# Patient Record
Sex: Male | Born: 1955 | Race: Black or African American | Hispanic: No | Marital: Single | Smoking: Former smoker
Health system: Southern US, Community
[De-identification: ages and names within clinical notes are randomized; demographics above are authoritative.]

## PROBLEM LIST (undated history)

## (undated) DIAGNOSIS — E785 Hyperlipidemia, unspecified: Secondary | ICD-10-CM

## (undated) DIAGNOSIS — I10 Essential (primary) hypertension: Secondary | ICD-10-CM

## (undated) DIAGNOSIS — S069XAA Unspecified intracranial injury with loss of consciousness status unknown, initial encounter: Secondary | ICD-10-CM

## (undated) DIAGNOSIS — I2510 Atherosclerotic heart disease of native coronary artery without angina pectoris: Secondary | ICD-10-CM

## (undated) HISTORY — DX: Unspecified intracranial injury with loss of consciousness status unknown, initial encounter: S06.9XAA

## (undated) HISTORY — PX: VENTRICULOPERITONEAL SHUNT: SHX204

---

## 2010-09-08 ENCOUNTER — Ambulatory Visit (HOSPITAL_BASED_OUTPATIENT_CLINIC_OR_DEPARTMENT_OTHER)
Admission: RE | Admit: 2010-09-08 | Discharge: 2010-09-08 | Disposition: A | Discharge status: Home / Self Care | Payer: Medicaid Other | Source: Ambulatory Visit | Attending: Internal Medicine | Admitting: Internal Medicine

## 2010-09-08 ENCOUNTER — Other Ambulatory Visit (HOSPITAL_BASED_OUTPATIENT_CLINIC_OR_DEPARTMENT_OTHER): Payer: Self-pay | Admitting: Internal Medicine

## 2010-09-08 DIAGNOSIS — R609 Edema, unspecified: Secondary | ICD-10-CM

## 2010-09-08 DIAGNOSIS — M7989 Other specified soft tissue disorders: Secondary | ICD-10-CM | POA: Insufficient documentation

## 2010-09-08 DIAGNOSIS — M79609 Pain in unspecified limb: Secondary | ICD-10-CM

## 2010-09-10 ENCOUNTER — Emergency Department (INDEPENDENT_AMBULATORY_CARE_PROVIDER_SITE_OTHER): Payer: Medicaid Other

## 2010-09-10 ENCOUNTER — Other Ambulatory Visit: Payer: Self-pay

## 2010-09-10 ENCOUNTER — Emergency Department (HOSPITAL_BASED_OUTPATIENT_CLINIC_OR_DEPARTMENT_OTHER)
Admission: EM | Admit: 2010-09-10 | Discharge: 2010-09-11 | Disposition: A | Discharge status: Other Acute Inpatient Hospital | Payer: Medicaid Other | Source: Home / Self Care | Attending: Emergency Medicine | Admitting: Emergency Medicine

## 2010-09-10 ENCOUNTER — Encounter: Payer: Self-pay | Admitting: *Deleted

## 2010-09-10 DIAGNOSIS — R748 Abnormal levels of other serum enzymes: Secondary | ICD-10-CM

## 2010-09-10 DIAGNOSIS — I2510 Atherosclerotic heart disease of native coronary artery without angina pectoris: Secondary | ICD-10-CM | POA: Insufficient documentation

## 2010-09-10 DIAGNOSIS — R197 Diarrhea, unspecified: Secondary | ICD-10-CM | POA: Insufficient documentation

## 2010-09-10 DIAGNOSIS — R112 Nausea with vomiting, unspecified: Secondary | ICD-10-CM | POA: Insufficient documentation

## 2010-09-10 DIAGNOSIS — R0602 Shortness of breath: Secondary | ICD-10-CM

## 2010-09-10 DIAGNOSIS — L03115 Cellulitis of right lower limb: Secondary | ICD-10-CM

## 2010-09-10 DIAGNOSIS — L03119 Cellulitis of unspecified part of limb: Secondary | ICD-10-CM | POA: Insufficient documentation

## 2010-09-10 DIAGNOSIS — R079 Chest pain, unspecified: Secondary | ICD-10-CM

## 2010-09-10 DIAGNOSIS — L02419 Cutaneous abscess of limb, unspecified: Secondary | ICD-10-CM | POA: Insufficient documentation

## 2010-09-10 DIAGNOSIS — R109 Unspecified abdominal pain: Secondary | ICD-10-CM | POA: Insufficient documentation

## 2010-09-10 DIAGNOSIS — I10 Essential (primary) hypertension: Secondary | ICD-10-CM | POA: Insufficient documentation

## 2010-09-10 HISTORY — DX: Essential (primary) hypertension: I10

## 2010-09-10 HISTORY — DX: Atherosclerotic heart disease of native coronary artery without angina pectoris: I25.10

## 2010-09-10 LAB — URINALYSIS, ROUTINE W REFLEX MICROSCOPIC
Bilirubin Urine: NEGATIVE
Glucose, UA: NEGATIVE mg/dL
Hgb urine dipstick: NEGATIVE
Ketones, ur: NEGATIVE mg/dL
Leukocytes, UA: NEGATIVE
Nitrite: NEGATIVE
Protein, ur: NEGATIVE mg/dL
Specific Gravity, Urine: 1.011 (ref 1.005–1.030)
Urobilinogen, UA: 0.2 mg/dL (ref 0.0–1.0)
pH: 6 (ref 5.0–8.0)

## 2010-09-10 LAB — DIFFERENTIAL
Basophils Absolute: 0 K/uL (ref 0.0–0.1)
Basophils Relative: 0 % (ref 0–1)
Eosinophils Absolute: 0.2 10*3/uL (ref 0.0–0.7)
Eosinophils Relative: 3 % (ref 0–5)
Lymphocytes Relative: 25 % (ref 12–46)
Lymphs Abs: 1.7 10*3/uL (ref 0.7–4.0)
Monocytes Absolute: 0.6 K/uL (ref 0.1–1.0)
Monocytes Relative: 9 % (ref 3–12)
Neutro Abs: 4.3 10*3/uL (ref 1.7–7.7)
Neutrophils Relative %: 64 % (ref 43–77)

## 2010-09-10 LAB — COMPREHENSIVE METABOLIC PANEL
ALT: 69 U/L — ABNORMAL HIGH (ref 0–53)
Alkaline Phosphatase: 88 U/L (ref 39–117)
CO2: 24 mEq/L (ref 19–32)
GFR calc Af Amer: >60 mL/min (ref >60)
Glucose, Bld: 90 mg/dL (ref 70–99)
Potassium: 4.2 mEq/L (ref 3.5–5.1)
Sodium: 138 mEq/L (ref 135–145)
Total Protein: 7.7 g/dL (ref 6.0–8.3)

## 2010-09-10 LAB — COMPREHENSIVE METABOLIC PANEL WITH GFR
AST: 46 U/L — ABNORMAL HIGH (ref 0–37)
Albumin: 4 g/dL (ref 3.5–5.2)
BUN: 11 mg/dL (ref 6–23)
Calcium: 9.6 mg/dL (ref 8.4–10.5)
Chloride: 104 meq/L (ref 96–112)
Creatinine, Ser: 1.3 mg/dL (ref 0.50–1.35)
GFR calc non Af Amer: 57 mL/min — ABNORMAL LOW (ref >60)
Total Bilirubin: 0.3 mg/dL (ref 0.3–1.2)

## 2010-09-10 LAB — CBC
HCT: 39.9 % (ref 39.0–52.0)
Hemoglobin: 14.2 g/dL (ref 13.0–17.0)
MCH: 31.3 pg (ref 26.0–34.0)
MCHC: 35.6 g/dL (ref 30.0–36.0)
MCV: 87.9 fL (ref 78.0–100.0)
Platelets: 255 10*3/uL (ref 150–400)
RBC: 4.54 MIL/uL (ref 4.22–5.81)
RDW: 13.3 % (ref 11.5–15.5)
WBC: 6.7 10*3/uL (ref 4.0–10.5)

## 2010-09-10 LAB — MAGNESIUM: Magnesium: 2.4 mg/dL (ref 1.5–2.5)

## 2010-09-10 LAB — D-DIMER, QUANTITATIVE: D-Dimer, Quant: 1.35 ug{FEU}/mL — ABNORMAL HIGH (ref 0.00–0.48)

## 2010-09-10 MED ORDER — VANCOMYCIN HCL 500 MG IV SOLR
INTRAVENOUS | Status: AC
  Filled 2010-09-10: qty 500

## 2010-09-10 MED ORDER — IOHEXOL 350 MG/ML SOLN
80.0000 mL | Freq: Once | INTRAVENOUS | Status: AC | PRN
  Administered 2010-09-10: 80 mL via INTRAVENOUS

## 2010-09-10 MED ORDER — MORPHINE SULFATE 4 MG/ML IJ SOLN
4.0000 mg | INTRAMUSCULAR | Status: DC | PRN
  Administered 2010-09-11: 4 mg via INTRAVENOUS
  Filled 2010-09-10: qty 1

## 2010-09-10 MED ORDER — VANCOMYCIN HCL 10 G IV SOLR
1.0000 g | Freq: Once | INTRAVENOUS | Status: AC
  Administered 2010-09-10: 1 g via INTRAVENOUS
  Filled 2010-09-10: qty 1000

## 2010-09-10 MED ORDER — FENTANYL CITRATE 0.05 MG/ML IJ SOLN
100.0000 ug | Freq: Once | INTRAMUSCULAR | Status: AC
  Administered 2010-09-10: 100 ug via INTRAVENOUS
  Filled 2010-09-10: qty 2

## 2010-09-10 NOTE — ED Notes (Signed)
Lower abdominal pain onset Saturday when started he was in [facility] they did stool sample said "infection in stool" was at hospital for infection in leg. Still having cramping and pain ...states "c diff" . Started being short of breath last night.

## 2010-09-10 NOTE — ED Notes (Signed)
Pt signed consent to obtain medical records of recent admission to [facility]

## 2010-09-10 NOTE — ED Notes (Signed)
Pt was admitted to [facility] for antibiotic treatment of cellulitis . He was released on Sunday states they gave him  " a bag of magnesium sulfate" before he left and the leg "swelled up and got worse" states he was sent home with doxycycline but quit taking it because it wasn't working also diagnosed by stool culture with c diff per patient and given metrodiazonole to take  500 mg every 8 hours pt took it for a day and "wasnt working" so quit taking it he had some bactrim 800 mg he was taking prior to going into the hospital and some amoxicillin clavunate  He was to take them together to treat cellutlits prior to being admitted had some left took it yesterday and today because he thinks that is what works but is concerned that they are too strong together so would rather just take one.

## 2010-09-10 NOTE — ED Notes (Signed)
Pt transported to xray 

## 2010-09-10 NOTE — ED Provider Notes (Addendum)
History     Chief Complaint  Patient presents with  . Abdominal Pain  . Shortness of Breath   HPI Comments: Pt developed pain and swelling to right lower leg from thigh to ankle and seen in the ED on 7/16.  He has h/o prior spider bite to right calf 1-2 years ago.  He had had chills, no CP or SOB, feeling weak.  He was seen at [facility] ED and was put on augmentin and bactrim antibiotics, went back for a ultrasound of leg that was negative by his report.  However 2 days later not getting better went back to the ED and was admitted.  He was put on an IV abx, but not helping, so switched to telfro which did help pain and swelling in right lower leg.  He did have complication of abdominal cramping, N/V/D and reportedly was then put on oral flagyl.  His vomiting and cramping improved, but is still having loose stools 2-3 times per day.  At home, he was released after receiving magnesium injection and given prescriptions for doxycycline and flagyl, but he only took 3 days of it due to persistent low appetite, abd bloating, upper epigastric pains and mild nausea.  He resumed taking his prior abx, the augmentin and bactrim and his abd cramping, and SOB has improved, although still present.  Also his right leg pain and swelling has again gotten worse again.  He is still takign his aspirn and lisinopril, but not the flagyl.    Patient is a 55 y.o. male presenting with abdominal pain and shortness of breath. The history is provided by the patient and a relative.  Abdominal Pain The primary symptoms of the illness include abdominal pain, shortness of breath, nausea, vomiting and diarrhea.  Additional symptoms associated with the illness include chills. Symptoms associated with the illness do not include back pain.  Shortness of Breath  Associated symptoms include chest pain and shortness of breath.    Past Medical History  Diagnosis Date  . Hypertension   . Coronary artery disease     History reviewed. No  pertinent past surgical history.  No family history on file.  History  Substance Use Topics  . Smoking status: Not on file  . Smokeless tobacco: Not on file  . Alcohol Use:       Review of Systems  Constitutional: Positive for chills and appetite change.  HENT: Negative.   Eyes: Negative.   Respiratory: Positive for shortness of breath.   Cardiovascular: Positive for chest pain.  Gastrointestinal: Positive for nausea, vomiting, abdominal pain, diarrhea and abdominal distention. Negative for blood in stool.  Genitourinary: Negative for flank pain and difficulty urinating.  Musculoskeletal: Positive for arthralgias. Negative for back pain and joint swelling.  Skin: Positive for color change. Negative for wound.    Physical Exam  BP 169/91  Pulse 64  Resp 20  Wt 177 lb 2 oz (80.343 kg)  SpO2 100%  Physical Exam  Constitutional: He is oriented to person, place, and time. He appears well-developed and well-nourished.  HENT:  Head: Normocephalic and atraumatic.  Eyes: Pupils are equal, round, and reactive to light.  Cardiovascular: Bradycardia present.   No murmur heard. Pulmonary/Chest: Effort normal and breath sounds normal. No respiratory distress.  Abdominal: Soft. Bowel sounds are normal. He exhibits no distension. There is no tenderness. There is no rebound.  Musculoskeletal:       Right upper leg: He exhibits swelling and edema. He exhibits no tenderness and  no deformity.       Right lower leg: He exhibits tenderness, swelling and edema. He exhibits no deformity.  Neurological: He is alert and oriented to person, place, and time. He has normal strength. No sensory deficit.  Skin: Skin is warm and intact. No rash noted.    ED Course  Procedures  MDM Pt's right leg is sig swollen, I think oral abx will not improve this, also with some new symptoms of epigastric pain, SOB and some pleurisy, although sats are normal, will need CT I think to r/o PE.  Will star ton IV  vanc which should cover cellulitis and also help with treatment of what I suspect to be C diff based on what meds he was put on upon discharge at [facility].  He does not wish to go back to [facility].     11:23 PM No PE on CT of chest.  sats ok, BP is only mildly elevated.    ECG at time 2009 shows NSR at rate 52, poor r wave progression, normal ST segments, minimal LAD, borderline PR length   Spoke to Dr. Toniann Fail who accepts in transfer to [facility] on medical floor.    Gavin Pound. Nicolle Heward, MD 09/10/10 2324   EKG shows rate of 53, normal interval, normal axis, poor r wave progression with Q wave Lead V1-V2. No prior EKG's available.  Gavin Pound. Oletta Lamas, MD 11/02/10 1945

## 2010-09-11 ENCOUNTER — Inpatient Hospital Stay (HOSPITAL_COMMUNITY): Payer: Medicaid Other

## 2010-09-11 ENCOUNTER — Inpatient Hospital Stay (HOSPITAL_COMMUNITY)
Admission: AD | Admit: 2010-09-11 | Discharge: 2010-09-13 | DRG: 372 | Disposition: A | Discharge status: Home / Self Care | Payer: Medicaid Other | Source: Other Acute Inpatient Hospital | Attending: Internal Medicine | Admitting: Internal Medicine

## 2010-09-11 DIAGNOSIS — F22 Delusional disorders: Secondary | ICD-10-CM | POA: Diagnosis present

## 2010-09-11 DIAGNOSIS — A0472 Enterocolitis due to Clostridium difficile, not specified as recurrent: Principal | ICD-10-CM | POA: Diagnosis present

## 2010-09-11 DIAGNOSIS — E785 Hyperlipidemia, unspecified: Secondary | ICD-10-CM | POA: Diagnosis present

## 2010-09-11 DIAGNOSIS — R079 Chest pain, unspecified: Secondary | ICD-10-CM | POA: Diagnosis present

## 2010-09-11 DIAGNOSIS — L02419 Cutaneous abscess of limb, unspecified: Secondary | ICD-10-CM | POA: Diagnosis present

## 2010-09-11 DIAGNOSIS — F411 Generalized anxiety disorder: Secondary | ICD-10-CM | POA: Diagnosis present

## 2010-09-11 DIAGNOSIS — I517 Cardiomegaly: Secondary | ICD-10-CM

## 2010-09-11 DIAGNOSIS — M79609 Pain in unspecified limb: Secondary | ICD-10-CM

## 2010-09-11 DIAGNOSIS — I10 Essential (primary) hypertension: Secondary | ICD-10-CM | POA: Diagnosis present

## 2010-09-11 LAB — COMPREHENSIVE METABOLIC PANEL
CO2: 26 mEq/L (ref 19–32)
Calcium: 9.9 mg/dL (ref 8.4–10.5)
Creatinine, Ser: 1.13 mg/dL (ref 0.50–1.35)
GFR calc Af Amer: >60 mL/min (ref >60)
GFR calc non Af Amer: >60 mL/min (ref >60)
Glucose, Bld: 100 mg/dL — ABNORMAL HIGH (ref 70–99)
Total Bilirubin: 0.4 mg/dL (ref 0.3–1.2)

## 2010-09-11 LAB — CBC
MCH: 31.7 pg (ref 26.0–34.0)
MCHC: 35.9 g/dL (ref 30.0–36.0)
Platelets: 264 10*3/uL (ref 150–400)
RBC: 5.04 MIL/uL (ref 4.22–5.81)
RDW: 13.8 % (ref 11.5–15.5)

## 2010-09-11 LAB — RAPID URINE DRUG SCREEN, HOSP PERFORMED
Amphetamines: NOT DETECTED
Benzodiazepines: NOT DETECTED
Cocaine: NOT DETECTED
Opiates: NOT DETECTED
Tetrahydrocannabinol: NOT DETECTED

## 2010-09-11 LAB — CARDIAC PANEL(CRET KIN+CKTOT+MB+TROPI)
CK, MB: 2.5 ng/mL (ref 0.3–4.0)
Total CK: 318 U/L — ABNORMAL HIGH (ref 7–232)
Total CK: 304 U/L — ABNORMAL HIGH (ref 7–232)
Total CK: 295 U/L — ABNORMAL HIGH (ref 7–232)
Troponin I: <0.3 ng/mL (ref <0.30)

## 2010-09-11 LAB — TSH: TSH: 2.12 u[IU]/mL (ref 0.350–4.500)

## 2010-09-11 LAB — MAGNESIUM: Magnesium: 2.5 mg/dL (ref 1.5–2.5)

## 2010-09-12 LAB — BASIC METABOLIC PANEL
Chloride: 102 mEq/L (ref 96–112)
GFR calc Af Amer: >60 mL/min (ref >60)
Potassium: 3.9 mEq/L (ref 3.5–5.1)
Sodium: 136 mEq/L (ref 135–145)

## 2010-09-13 LAB — BASIC METABOLIC PANEL
BUN: 13 mg/dL (ref 6–23)
GFR calc Af Amer: >60 mL/min (ref >60)
GFR calc non Af Amer: >60 mL/min (ref >60)
Potassium: 3.8 mEq/L (ref 3.5–5.1)
Sodium: 138 mEq/L (ref 135–145)

## 2010-09-13 LAB — CBC
HCT: 41.8 % (ref 39.0–52.0)
MCHC: 36.1 g/dL — ABNORMAL HIGH (ref 30.0–36.0)
Platelets: 276 10*3/uL (ref 150–400)
RDW: 13.4 % (ref 11.5–15.5)

## 2010-09-13 LAB — HEPATITIS PANEL, ACUTE
HCV Ab: NEGATIVE
Hep A IgM: NEGATIVE
Hep B C IgM: NEGATIVE
Hepatitis B Surface Ag: NEGATIVE

## 2010-09-14 LAB — GIARDIA/CRYPTOSPORIDIUM SCREEN(EIA)

## 2010-09-15 LAB — STOOL CULTURE

## 2010-10-12 NOTE — Discharge Summary (Signed)
NAMEFERGUS, THRONE NO.:  [id number]  MEDICAL RECORD NO.:  [id number]  LOCATION:  5006                         FACILITY:  [facility]  PHYSICIAN:  Clydia Llano, MD       DATE OF BIRTH:  February 04, 1956  DATE OF ADMISSION:  09/11/2010 DATE OF DISCHARGE:  09/13/2010                              DISCHARGE SUMMARY   PRIMARY CARE PHYSICIAN:  [name], MD  REASON FOR ADMISSION:  Persistent swelling of the left lower extremity with diarrhea.  DISCHARGE DIAGNOSES: 1. Clostridium difficile colitis. 2. Right lower extremity cellulitis. 3. Chest pain, resolved. 4. Hypertension. 5. Dyslipidemia. 6. Transaminitis, resolved.  DISCHARGE MEDICATIONS: 1. Bactrim DS 1 tablet p.o. b.i.d. take for 7 days. 2. Aspirin 325 mg p.o. daily. 3. Lisinopril 10 mg p.o. daily. 4. Flagyl 500 mg every 8 hours for 10 more days. 5. Potassium chloride 20 mEq p.o. every 12 hours.  RADIOLOGY: 1. Right ankle x-ray showed no osseous abnormality. 2. Abdominal ultrasound on September 11, 2010, showed no acute findings. 3. CT angio negative for PE or thoracic aortic dissection. 4. Chest x-ray showed no acute disease. 5. Right lower extremity Doppler showed no evidence of DVT or     superficial thrombophlebitis in the right lower extremity or left     lower extremity. 6. Echocardiogram showed ejection fraction of about 63%.  Has Doppler     parameters consistent with grade 1 diastolic dysfunction.  There is     no significant aortic and mitral valve lesion, dysfunction.  There     are no wall motion abnormalities.  BRIEF HISTORY AND EXAMINATION:  Mr. Parson is a 55 year old male with a past medical history of hypertension and hyperlipidemia.  The patient developed right lower extremity swelling which started as pain in the groin almost 10 days ago.  The patient went to [facility].  The patient was treated with antibiotic and the patient was sent home.  He told  the story that he came back to the hospital 3 days later with diarrhea and worsening of his cellulitis.  The patient admitted, treated for C. diff colitis and cellulitis and then sent home. He said he went home, he stayed for sometimes, and the diarrhea is not getting better as well as the cellulitis, so he came in back to [facility] for further evaluation.  The patient's swelling is persistent, worsening, associated with pain.  The patient denies shortness of breath and chest pain.  BRIEF HOSPITAL STAY: 1. C. diff colitis.  The patient admitted to the hospital.  The     patient was already on Flagyl that was continued throughout the     hospital stay.  The patient seems that he is improving very well on     that.  On the very next day, he had only one bowel movement which     is formed.  On the day of discharge, he had one formed bowel     movement.  The patient was deemed safe to be discharged home on the     same antibiotic to continue for 10 more days.  Please note     admission  notes here, the patient did not take his medication after     1 or 2 days because he feel it is not helping him.  The patient to     continue on the Flagyl for 10 more days. 2. Right lower extremity cellulitis.  The patient did have extensive     workup for that.  The patient then treated as cellulitis.  Because     of the swelling, the patient did have a 2-D echocardiogram which     showed normal heart function.  Had CT angiography because he     mentions some chest pain, did not show any PEs.  The patient also     had bilateral lower extremity Dopplers which showed no DVTs.  The     patient was on vancomycin during the hospital stay which helped his     pain as he said.  Because of his skin complexion, it is hard to see     if there is any significant redness, but the swelling was getting     down at the time of discharge.  The patient able to bear weight on     his right lower extremity. 3.  Chest pain.  At the time of admission, the patient was mentioning     about chest pain.  Cardiac enzymes, 12-lead EKG, CT angiography,     echocardiogram were all normal.  The patient's chest pain resolved.     To begin with, the pain was very vague and does seem very atypical. 4. Anxiety.  The patient does have anxiety disorder.  He sees Dr. Cloyd Stagers-     Bonsu for that.  During the hospital stay, the patient exhibited     paranoid behavior that the medications given to him is not his or     even accused one of the nurses that she is trying to terminate him.     That was well documented in the chart.  I discussed with the     patient that he does have paranoid behavior and he should speak to     Dr. Julio Sicks about it and he might benefit from referral to     Psychiatry, the patient told me like he is already going to     psychiatrist.  The patient is not suicidal, is not homicidal and     very articulate as well as reasonable. 5. Hypertension, controlled.  The night before discharge, it went up a     little bit.  The patient discharged in his same lisinopril dose.     This can be titrated according to reading in the office for his  blood pressure.  DISCHARGE INSTRUCTIONS: 1. Activity:  As tolerated. 2. Disposition:  Home. 3. Diet:  Regular.     Clydia Llano, MD     ME/MEDQ  D:  09/13/2010  T:  09/13/2010  Job:  [id number]  cc:   [name], M.D.  Electronically Signed by Clydia Llano  on 10/12/2010 10:00:07 AM

## 2010-10-20 NOTE — H&P (Signed)
Nicholas Coffey, ARAKAWA NO.:  [id number]  MEDICAL RECORD NO.:  [id number]  LOCATION:  5021                         FACILITY:  [facility]  PHYSICIAN:  Eduard Clos, MDDATE OF BIRTH:  [birth year]  DATE OF ADMISSION:  09/11/2010 DATE OF DISCHARGE:                             HISTORY & PHYSICAL   PRIMARY CARE PHYSICIAN:  [name], MD  CHIEF COMPLAINT:  Persistent swelling of the right lower extremity with chest pain and diarrhea.  HISTORY OF PRESENT ILLNESS:  A 55 year old male with history of hypertension, hyperlipidemia, has started developing some swelling in the right lower extremity which started off as pain in the groin almost 10 days ago when he had gone to [facility]. Initially was given Bactrim, despite taking which the swelling worsened, the swelling was more in the right calf muscle and ankle.  Denies any trauma or insect bite.  He had a similar swelling 2 years ago when he had insect bite.  Second time, he was admitted and placed on IV antibiotics.  He also as per the patient had a Doppler.  We do not have the results.  The patient was in the hospital for 3 days with IV antibiotic, eventually discharged on doxycycline.  By the time he was discharged, he started developing diarrhea and he was told he had C. diff.  He was given Flagyl.  He took Flagyl only for 1 day.  Now, he has been having persistent diarrhea with crampy abdominal pain.  He also has been having off and on chest pain, more retrosternal, nonradiating.  He is unable to exactly characterize the pain.  Denies any shortness of breath.  He came back to the ER at [facility] ER. In the ER, the patient had a CT chest, which is negative for any PE because of persistent swelling in the right lower extremity, at this time was felt that the patient will need few more days of antibiotics.  The patient said he gets abdominal pain only crampy type  and it is usually associated with his diarrhea.  Denies any blood in the diarrhea.  The patient denies any dizziness, loss of conscious, any focal deficit. Denies any shortness of breath.  Denies any fever, chills at this time, but he did have it when he initially presented to ER at [facility] 10 days ago.  PAST MEDICAL HISTORY:  Hypertension, hyperlipidemia.  MEDICATIONS ON ADMISSION:  As per the patient, the patient is on lisinopril 10 mg p.o. daily, aspirin 325 mg daily, he takes a medicine for hyperlipidemia he does not recall the name, he was supposed to be on doxycycline and Flagyl which he is not taking.  ALLERGIES:  NO KNOWN DRUG ALLERGIES.  FAMILY HISTORY:  Nothing contributory.  SOCIAL HISTORY:  The patient denies smoking cigarettes, drinking alcohol or use illegal drugs.  REVIEW OF SYSTEMS:  As per the history of present illness, nothing else significant.  PHYSICAL EXAMINATION:  GENERAL:  The patient examined at bedside, not in acute distress. VITAL SIGNS:  Blood pressure 169/103, pulse is 58 per minute, temperature 98.3, respirations 18 per minute, O2 sat 97% on room air. HEENT:  Anicteric.  No pallor.  No discharge from ears, eyes, nose or mouth. CHEST:  Bilateral air entry present.  No rhonchi.  No crepitation. HEART:  S1, S2 heard. ABDOMEN:  Soft, nontender.  Bowel sounds heard. CNS:  The patient is alert, awake and oriented to time, place and person.  Moves upper and lower extremities 5/5. EXTREMITIES:  There is 2+ edema on the right lower extremity, involving the ankle and lower part of his leg.  At this time, he does not have any definite erythema, but the patient stated he had severe erythema extending up to his thighs 10 days ago.  Pulse are present.  There is no acute ischemic changes, cyanosis or clubbing.  LABORATORY DATA:  Chest x-ray shows, no acute disease.  Sonogram of the lower extremities done on September 08, 2010, shows no evidence of  lower extremity deep vein thrombosis.  CT angio chest done yesterday shows negative for acute PE, thoracic aortic dissection, coronary artery and aortic valve calcifications.  CBC; WBC 6.7, hemoglobin is 14.2, hematocrit is 39.9, platelets 255.  D-dimer is 1.35.  Complete metabolic panel; sodium 138, potassium 4.2, chloride 104, carbon dioxide 24, glucose 90, BUN 11, creatinine 1.3, total bilirubin is 0.3, alkaline phosphatase 88, AST 46, ALT 69, total protein 7.7, albumin 4, calcium 9.6, magnesium 2.4.  UA is negative for nitrite and leukocyte.  ASSESSMENT: 1. Cellulitis of the right lower extremity. 2. Uncontrolled hypertension. 3. Chest pain. 4. History of hyperlipidemia. 5. Diarrhea.  PLAN: 1. At this time, we will admit the patient to telemetry. 2. For his right lower extremity cellulitis, at this time, we will     continue with vancomycin and ciprofloxacin.  I am going to repeat a     Doppler to make sure there is no developing DVT. 3. Uncontrolled hypertension.  I am going to keep the patient on     p.r.n. hydralazine and we need to continue his lisinopril, we need     to verify his dose first. 4. Chest discomfort.  Given his risk factors including hypertension,     hyperlipidemia and there is some coronary artery calcification of     the CT chest, I am going to get EKG at this time, cycle cardiac     markers.  We will also get a 2-D echo.  Presently chest pain free. 5. Diarrhea.  The patient states he was diagnosed with C diff at [facility].  At this time, we will repeat again C.     diff.  The patient will be on Flagyl in addition to his vancomycin     and Cipro. 6. Further recommendation based on test order and clinic course.     Eduard Clos, MD    ANK/MEDQ  D:  09/11/2010  T:  09/11/2010  Job:  [id number]  Electronically Signed by Midge Minium MD on 10/20/2010 08:55:00 AM

## 2011-06-18 DIAGNOSIS — S0285XA Fracture of orbit, unspecified, initial encounter for closed fracture: Secondary | ICD-10-CM | POA: Insufficient documentation

## 2011-06-18 DIAGNOSIS — S062XAA Diffuse traumatic brain injury with loss of consciousness status unknown, initial encounter: Secondary | ICD-10-CM | POA: Insufficient documentation

## 2011-06-18 DIAGNOSIS — G9389 Other specified disorders of brain: Secondary | ICD-10-CM | POA: Insufficient documentation

## 2011-06-18 DIAGNOSIS — S128XXA Fracture of other parts of neck, initial encounter: Secondary | ICD-10-CM | POA: Insufficient documentation

## 2011-06-18 DIAGNOSIS — S0101XA Laceration without foreign body of scalp, initial encounter: Secondary | ICD-10-CM | POA: Insufficient documentation

## 2011-06-18 DIAGNOSIS — S0291XA Unspecified fracture of skull, initial encounter for closed fracture: Secondary | ICD-10-CM | POA: Insufficient documentation

## 2011-06-18 DIAGNOSIS — E872 Acidosis, unspecified: Secondary | ICD-10-CM | POA: Insufficient documentation

## 2011-06-18 DIAGNOSIS — J95821 Acute postprocedural respiratory failure: Secondary | ICD-10-CM | POA: Insufficient documentation

## 2011-06-18 DIAGNOSIS — R600 Localized edema: Secondary | ICD-10-CM | POA: Insufficient documentation

## 2011-06-18 DIAGNOSIS — S066XAA Traumatic subarachnoid hemorrhage with loss of consciousness status unknown, initial encounter: Secondary | ICD-10-CM | POA: Insufficient documentation

## 2011-06-18 DIAGNOSIS — D72829 Elevated white blood cell count, unspecified: Secondary | ICD-10-CM | POA: Insufficient documentation

## 2011-06-18 DIAGNOSIS — S0219XA Other fracture of base of skull, initial encounter for closed fracture: Secondary | ICD-10-CM | POA: Insufficient documentation

## 2011-06-18 DIAGNOSIS — S0630AA Unspecified focal traumatic brain injury with loss of consciousness status unknown, initial encounter: Secondary | ICD-10-CM | POA: Insufficient documentation

## 2011-06-18 DIAGNOSIS — Z01818 Encounter for other preprocedural examination: Secondary | ICD-10-CM | POA: Insufficient documentation

## 2011-06-18 DIAGNOSIS — S065XAA Traumatic subdural hemorrhage with loss of consciousness status unknown, initial encounter: Secondary | ICD-10-CM | POA: Insufficient documentation

## 2011-06-19 DIAGNOSIS — E876 Hypokalemia: Secondary | ICD-10-CM | POA: Insufficient documentation

## 2011-06-19 DIAGNOSIS — E778 Other disorders of glycoprotein metabolism: Secondary | ICD-10-CM | POA: Insufficient documentation

## 2011-06-21 DIAGNOSIS — D62 Acute posthemorrhagic anemia: Secondary | ICD-10-CM | POA: Insufficient documentation

## 2011-07-02 DIAGNOSIS — Z982 Presence of cerebrospinal fluid drainage device: Secondary | ICD-10-CM | POA: Insufficient documentation

## 2011-07-02 DIAGNOSIS — G911 Obstructive hydrocephalus: Secondary | ICD-10-CM | POA: Insufficient documentation

## 2011-07-02 DIAGNOSIS — S069X9A Unspecified intracranial injury with loss of consciousness of unspecified duration, initial encounter: Secondary | ICD-10-CM | POA: Insufficient documentation

## 2011-07-02 DIAGNOSIS — R4189 Other symptoms and signs involving cognitive functions and awareness: Secondary | ICD-10-CM | POA: Insufficient documentation

## 2011-07-02 DIAGNOSIS — R569 Unspecified convulsions: Secondary | ICD-10-CM | POA: Insufficient documentation

## 2011-07-03 DIAGNOSIS — R1310 Dysphagia, unspecified: Secondary | ICD-10-CM | POA: Insufficient documentation

## 2011-07-08 DIAGNOSIS — N289 Disorder of kidney and ureter, unspecified: Secondary | ICD-10-CM | POA: Insufficient documentation

## 2011-08-25 ENCOUNTER — Ambulatory Visit: Payer: Medicaid Other | Admitting: Rehabilitative and Restorative Service Providers"

## 2011-08-26 ENCOUNTER — Ambulatory Visit: Payer: Medicaid Other | Attending: Neurology | Admitting: Physical Therapy

## 2011-08-26 DIAGNOSIS — I69919 Unspecified symptoms and signs involving cognitive functions following unspecified cerebrovascular disease: Secondary | ICD-10-CM | POA: Insufficient documentation

## 2011-08-26 DIAGNOSIS — Z5189 Encounter for other specified aftercare: Secondary | ICD-10-CM | POA: Insufficient documentation

## 2011-08-26 DIAGNOSIS — R279 Unspecified lack of coordination: Secondary | ICD-10-CM | POA: Insufficient documentation

## 2011-08-26 DIAGNOSIS — I69998 Other sequelae following unspecified cerebrovascular disease: Secondary | ICD-10-CM | POA: Insufficient documentation

## 2011-08-26 DIAGNOSIS — M6281 Muscle weakness (generalized): Secondary | ICD-10-CM | POA: Insufficient documentation

## 2011-08-26 DIAGNOSIS — R269 Unspecified abnormalities of gait and mobility: Secondary | ICD-10-CM | POA: Insufficient documentation

## 2011-08-31 ENCOUNTER — Ambulatory Visit: Payer: Medicaid Other | Admitting: Physical Therapy

## 2011-09-02 ENCOUNTER — Ambulatory Visit: Payer: Medicaid Other

## 2011-09-02 ENCOUNTER — Ambulatory Visit: Payer: Medicaid Other | Admitting: Occupational Therapy

## 2011-09-02 ENCOUNTER — Ambulatory Visit: Payer: Medicaid Other | Admitting: Physical Therapy

## 2011-09-07 ENCOUNTER — Ambulatory Visit: Payer: Medicaid Other | Admitting: Physical Therapy

## 2011-09-09 ENCOUNTER — Ambulatory Visit: Payer: Medicaid Other | Admitting: Physical Therapy

## 2011-09-13 ENCOUNTER — Ambulatory Visit: Payer: Medicaid Other | Admitting: Physical Therapy

## 2011-09-13 ENCOUNTER — Encounter: Payer: Medicaid Other | Admitting: Speech Pathology

## 2011-09-15 ENCOUNTER — Encounter: Payer: Medicaid Other | Admitting: Speech Pathology

## 2011-09-15 ENCOUNTER — Ambulatory Visit: Payer: Medicaid Other | Admitting: Physical Therapy

## 2011-09-21 ENCOUNTER — Ambulatory Visit: Payer: Medicaid Other | Admitting: Rehabilitative and Restorative Service Providers"

## 2011-09-21 ENCOUNTER — Encounter: Payer: Medicaid Other | Admitting: Occupational Therapy

## 2011-09-23 ENCOUNTER — Encounter: Payer: Medicaid Other | Admitting: Occupational Therapy

## 2011-09-23 ENCOUNTER — Ambulatory Visit: Payer: Medicaid Other | Admitting: Physical Therapy

## 2011-09-28 ENCOUNTER — Encounter: Payer: Medicaid Other | Admitting: Occupational Therapy

## 2011-09-28 ENCOUNTER — Ambulatory Visit: Payer: Medicaid Other | Admitting: Physical Therapy

## 2011-09-30 ENCOUNTER — Ambulatory Visit: Payer: Medicaid Other | Admitting: Physical Therapy

## 2011-09-30 ENCOUNTER — Encounter: Payer: Medicaid Other | Admitting: Occupational Therapy

## 2011-10-05 ENCOUNTER — Ambulatory Visit: Payer: Medicaid Other | Admitting: Physical Therapy

## 2011-10-05 ENCOUNTER — Encounter: Payer: Medicaid Other | Admitting: Occupational Therapy

## 2011-10-07 ENCOUNTER — Encounter: Payer: Medicaid Other | Admitting: Occupational Therapy

## 2011-10-07 ENCOUNTER — Ambulatory Visit: Payer: Medicaid Other | Admitting: Physical Therapy

## 2011-10-12 ENCOUNTER — Encounter: Payer: Medicaid Other | Admitting: Occupational Therapy

## 2011-10-12 ENCOUNTER — Ambulatory Visit: Payer: Medicaid Other | Admitting: Physical Therapy

## 2011-10-14 ENCOUNTER — Encounter: Payer: Medicaid Other | Admitting: Occupational Therapy

## 2011-10-14 ENCOUNTER — Ambulatory Visit: Payer: Medicaid Other | Admitting: Physical Therapy

## 2011-10-19 ENCOUNTER — Encounter: Payer: Medicaid Other | Admitting: Occupational Therapy

## 2011-10-21 ENCOUNTER — Encounter: Payer: Medicaid Other | Admitting: Occupational Therapy

## 2012-07-15 IMAGING — CT CT ANGIO CHEST
2 of 6 series · 19 of 36 positions shown · IV contrast (APPLIED)
Comparison: None.

CLINICAL DATA: Elevated D-dimer, shortness of breath, chest pain

CT ANGIOGRAPHY CHEST WITH CONTRAST
TECHNIQUE: Multidetector CT imaging of the chest was performed
using the standard protocol during bolus administration of
intravenous contrast.  Multiplanar CT image reconstructions
including MIPs were obtained to evaluate the vascular anatomy.
Contrast:  80 ml Omnipaque 350 IV

[Series 7: pe 1.0 b25f · axial · 0.59mm/px · z∈[-152,+56]mm · 18 of 231 slices shown]
[im 12/231  lung]
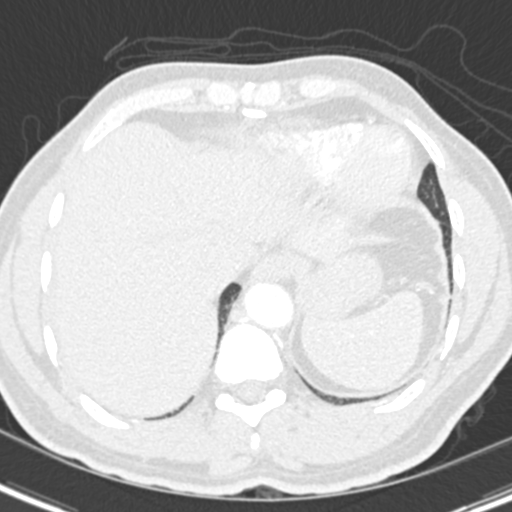
[im 24/231  mediastinal]
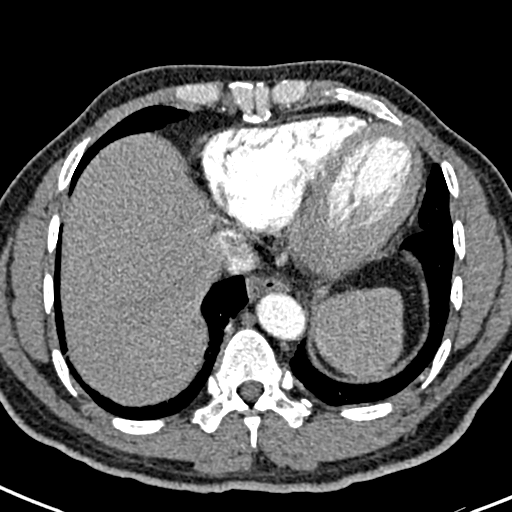
[im 35/231  lung]
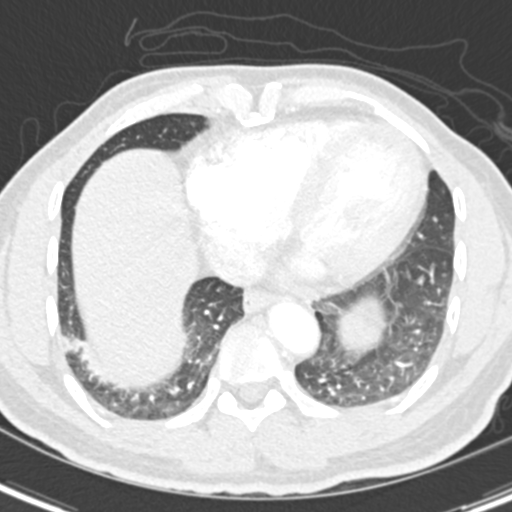
[im 47/231  mediastinal]
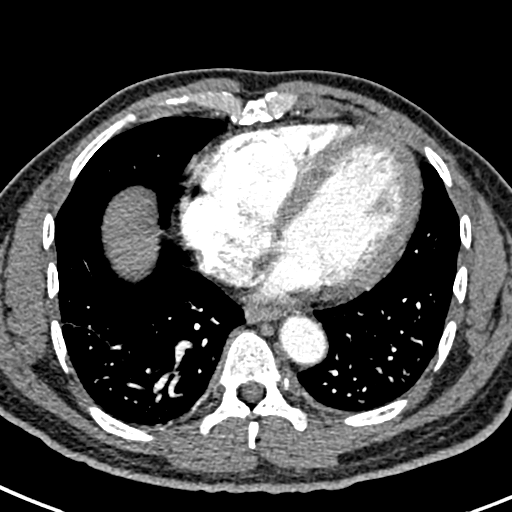
[im 58/231  lung]
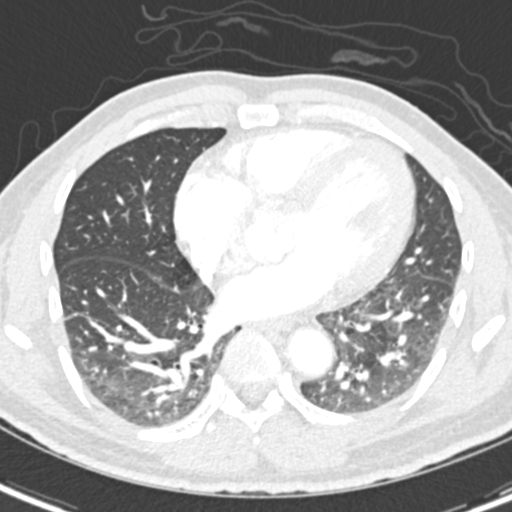
[im 70/231  mediastinal]
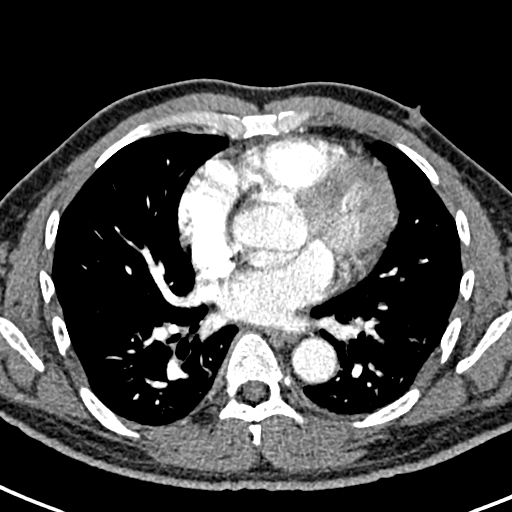
[im 81/231  lung]
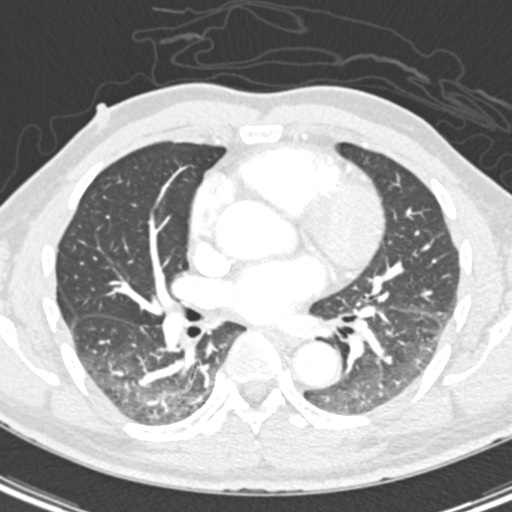
[im 93/231  mediastinal]
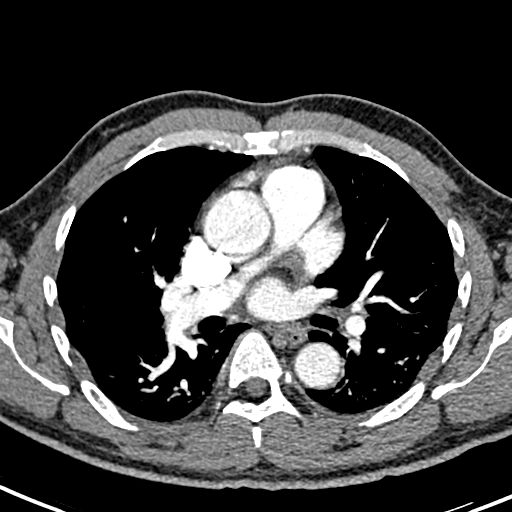
[im 104/231  lung]
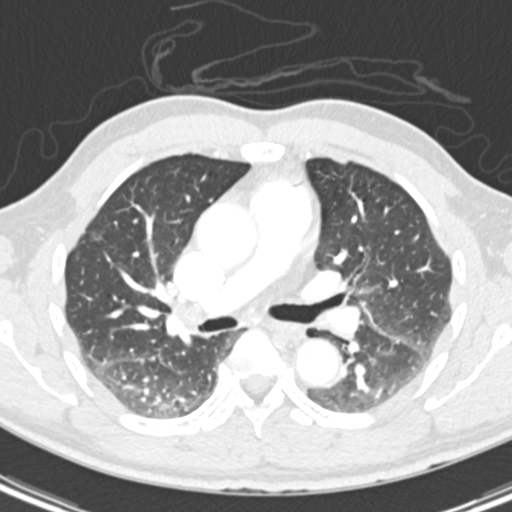
[im 127/231  mediastinal]
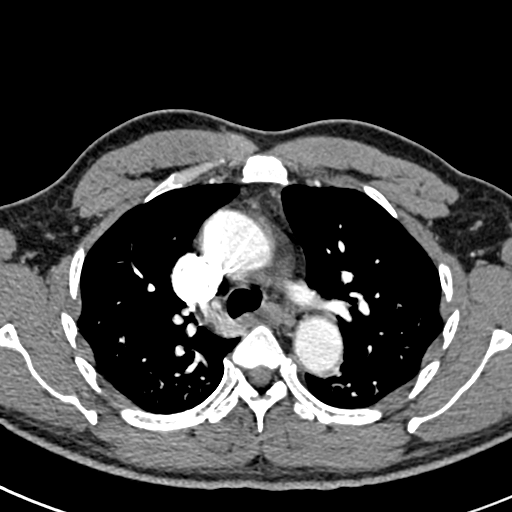
[im 139/231  lung]
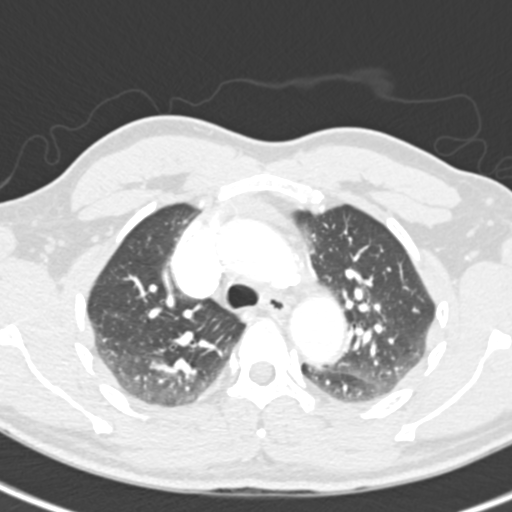
[im 150/231  mediastinal]
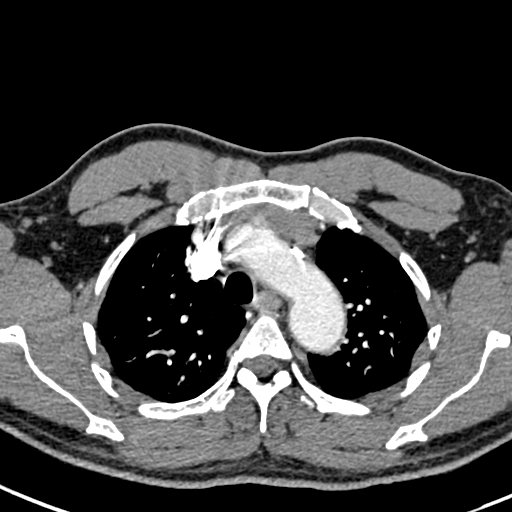
[im 162/231  lung]
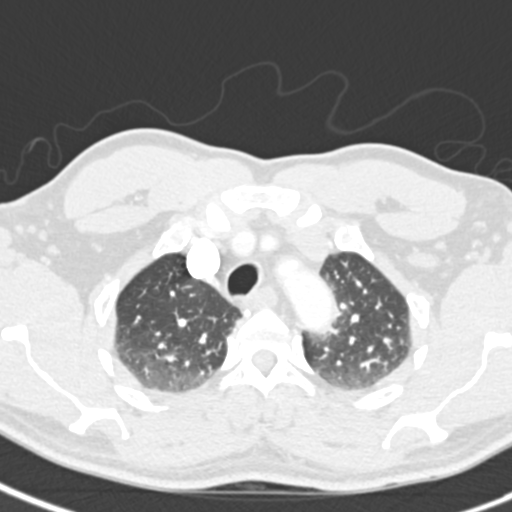
[im 173/231  mediastinal]
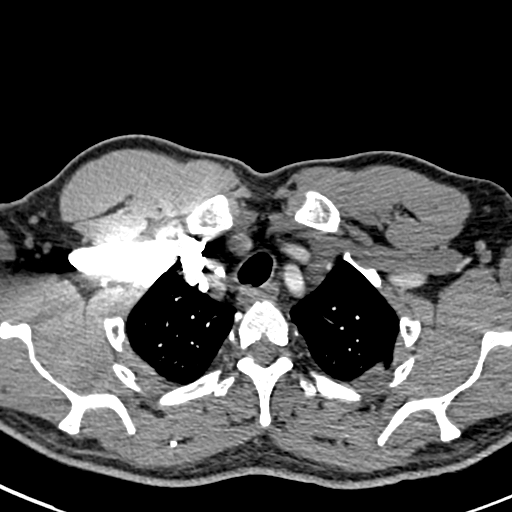
[im 185/231  lung]
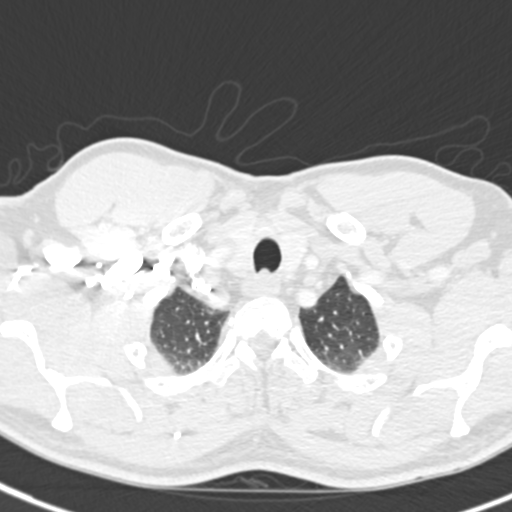
[im 196/231  mediastinal]
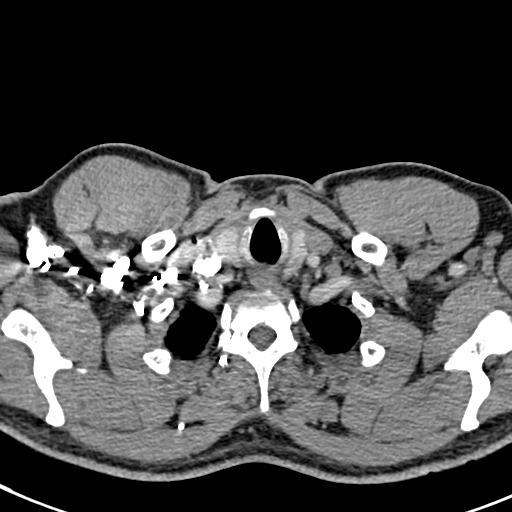
[im 208/231  lung]
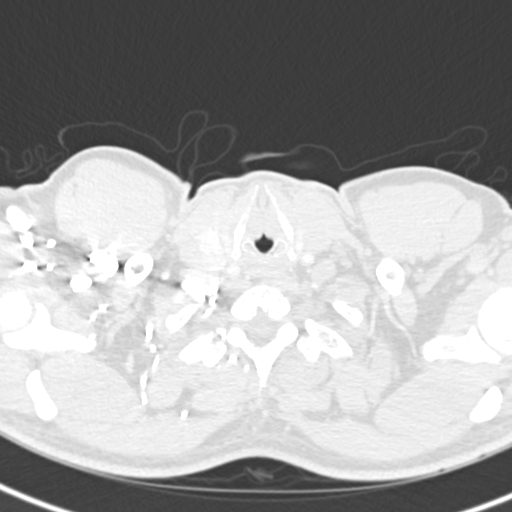
[im 219/231  mediastinal]
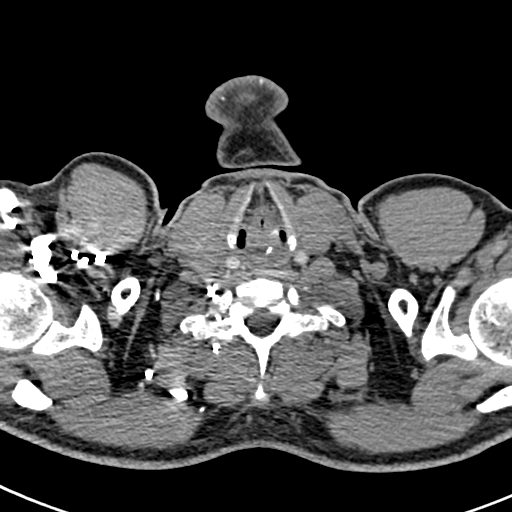

[Series 10: pe 2.0 coronal · coronal · 0.48mm/px · 1 of 107 slices shown]
[im 54/107  mediastinal]
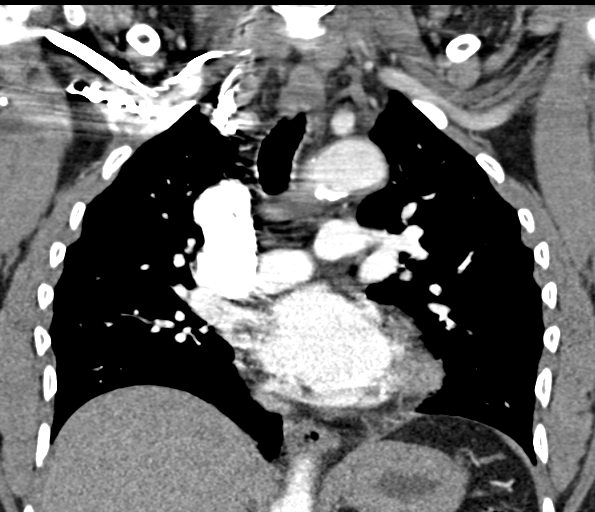

[19 of 36 positions shown; findings below may reference images not displayed]

FINDINGS: There is good contrast opacification of the pulmonary
artery branches.  No discrete filling defect to suggest acute
PE.Good contrast opacification of the thoracic aorta with no
evidence of dissection, aneurysm, or stenosis. There is classic 3-
vessel brachiocephalic arch anatomy.  Patchy coronary and aortic
arch calcifications.  No pleural or pericardial effusion.
No hilar or mediastinal adenopathy.  6 mm nodule adjacent to the
minor fissure image 48/8.  Dependent atelectasis posteriorly.  No
confluent airspace infiltrate.  Visualized portions of upper
abdomen unremarkable.

Review of the MIP images confirms the above findings.
IMPRESSION: 1.  Negative for acute PE or thoracic aortic dissection.
2.  Coronary and aortic arch calcifications.

## 2012-07-16 IMAGING — US US ABDOMEN COMPLETE
1 series · 14 of 25 positions shown · non-contrast
Comparison: None.

CLINICAL DATA: Abnormal LFTs.

COMPLETE ABDOMINAL ULTRASOUND

[Series 1: us abdomen complete · 0.31mm/px · 14 of 84 slices shown]
[im 1/84]
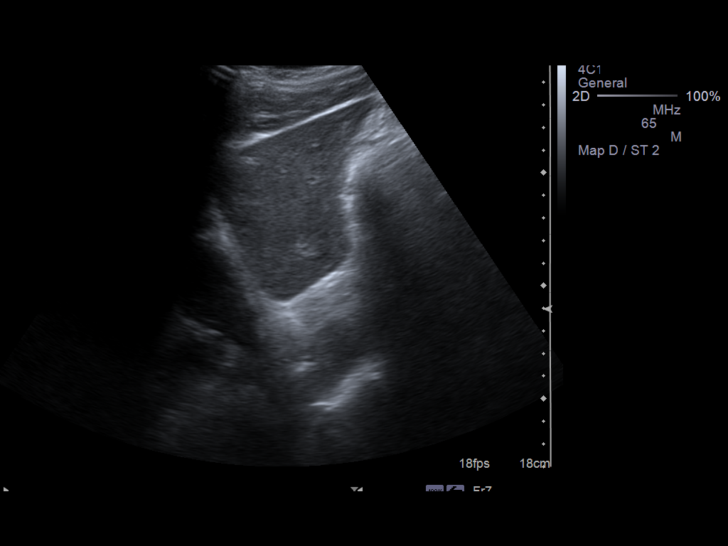
[im 7/84]
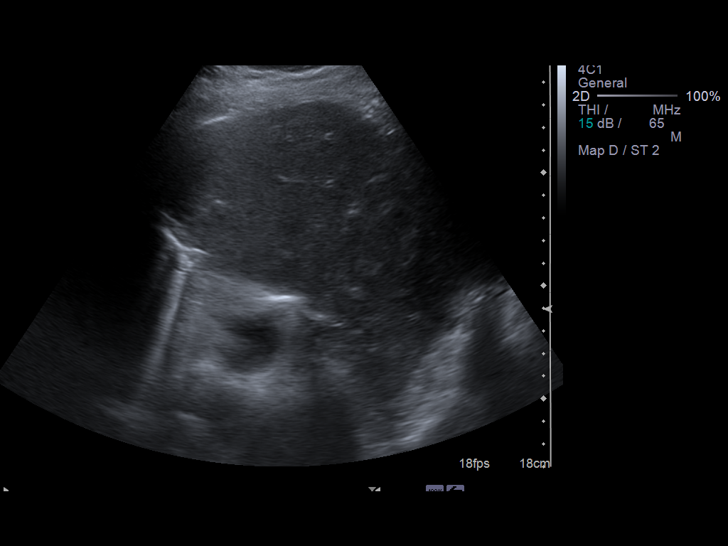
[im 14/84]
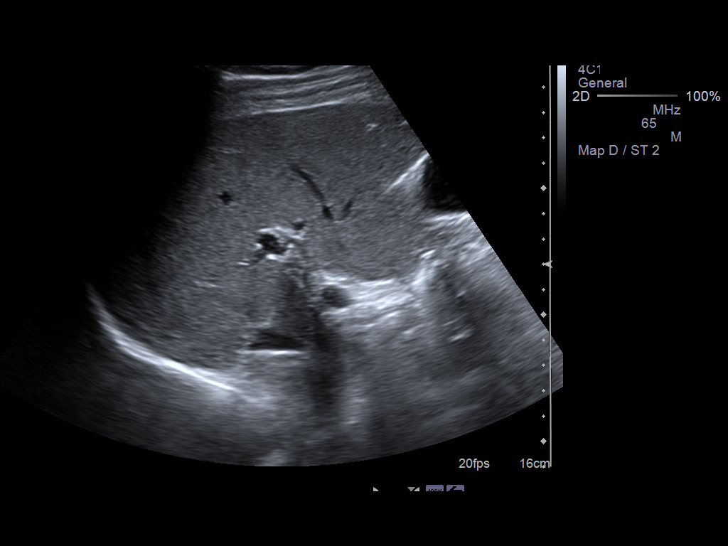
[im 21/84]
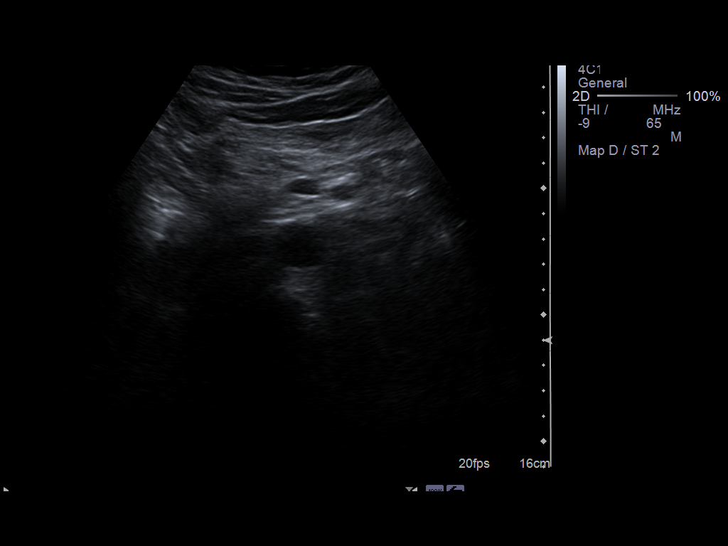
[im 28/84]
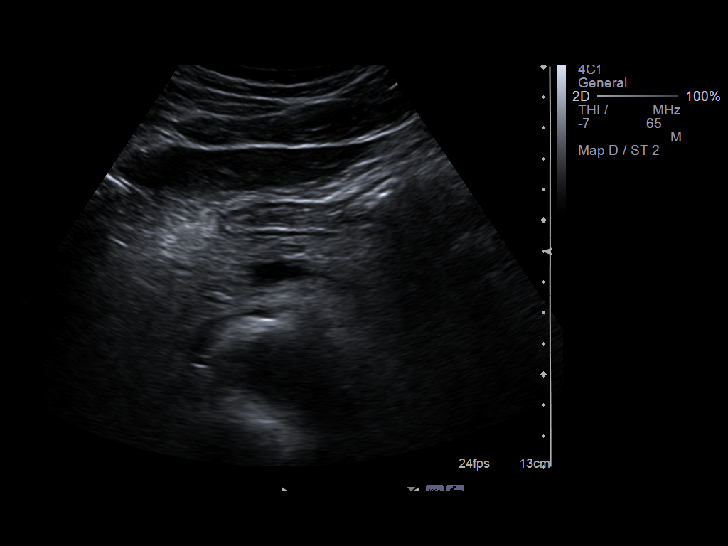
[im 32/84]
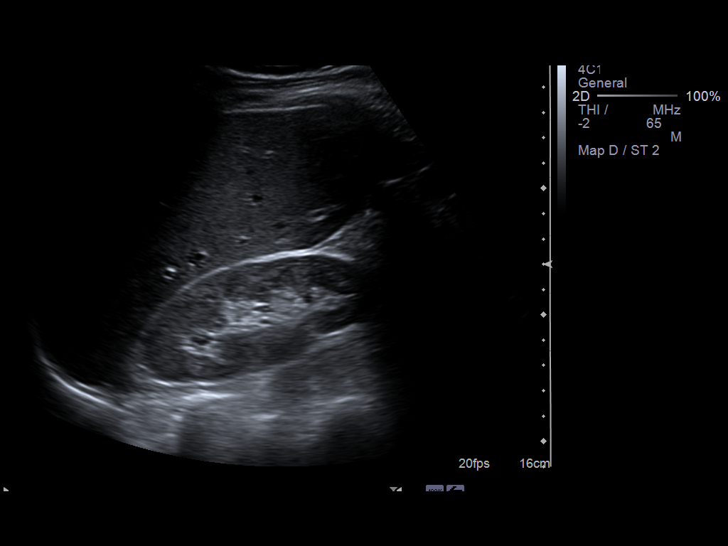
[im 39/84]
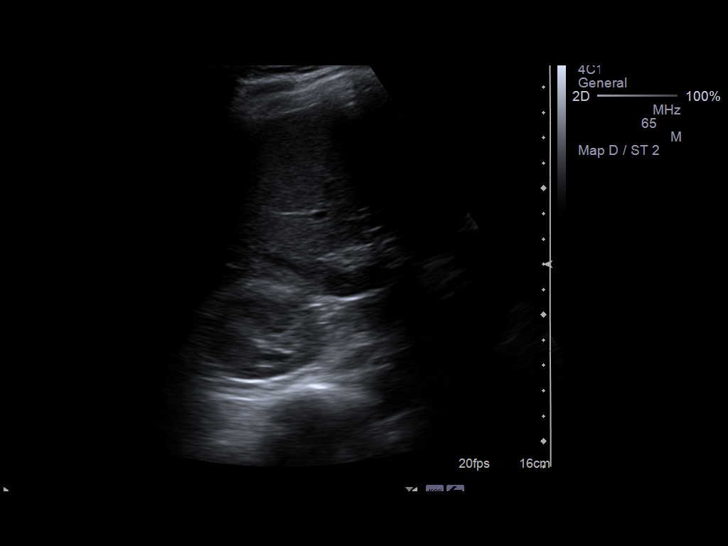
[im 45/84]
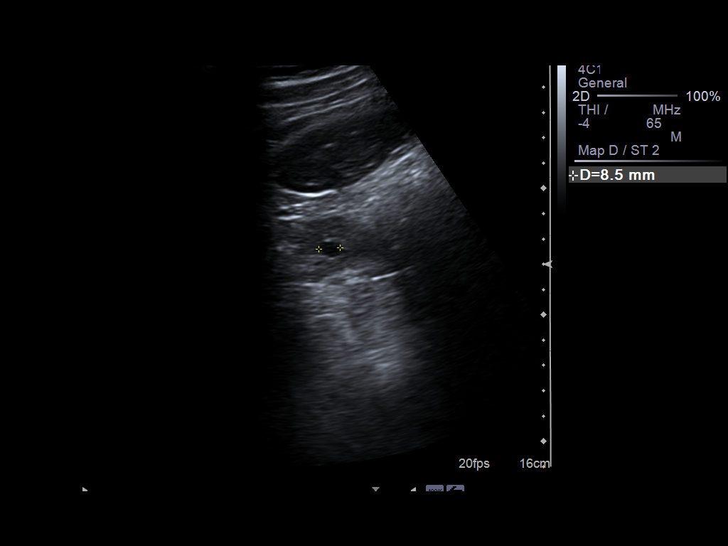
[im 52/84]
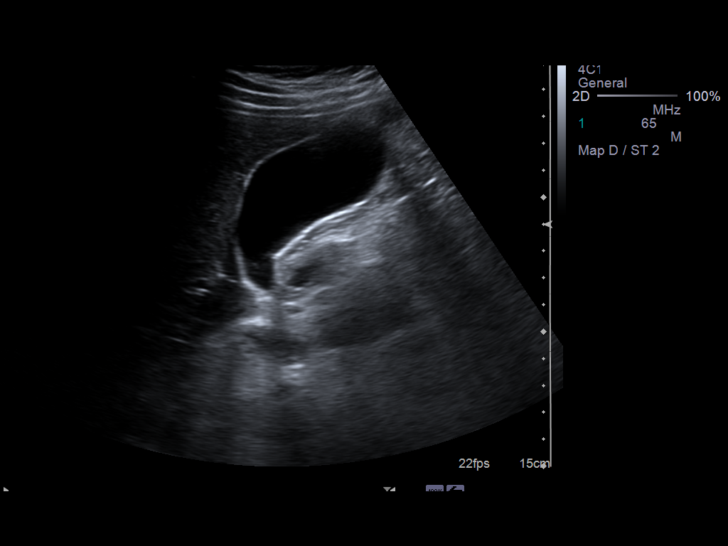
[im 56/84]
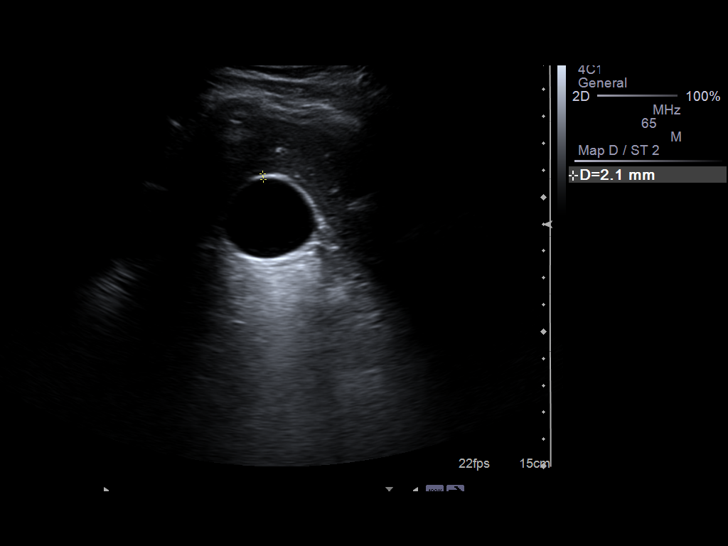
[im 63/84]
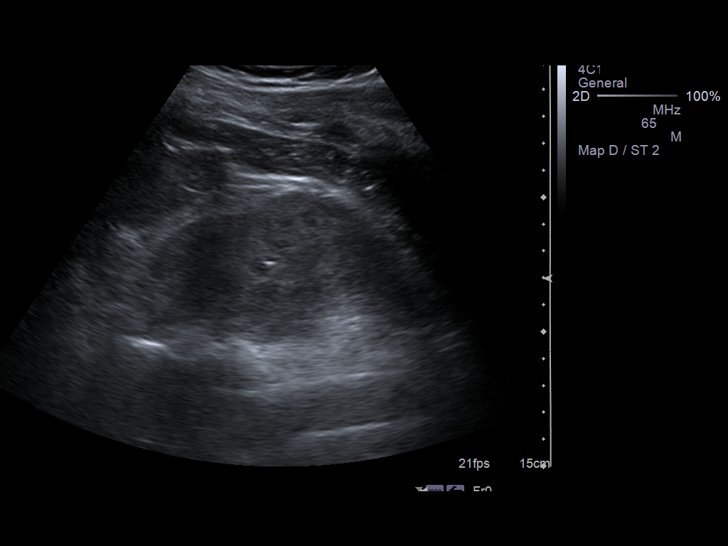
[im 70/84]
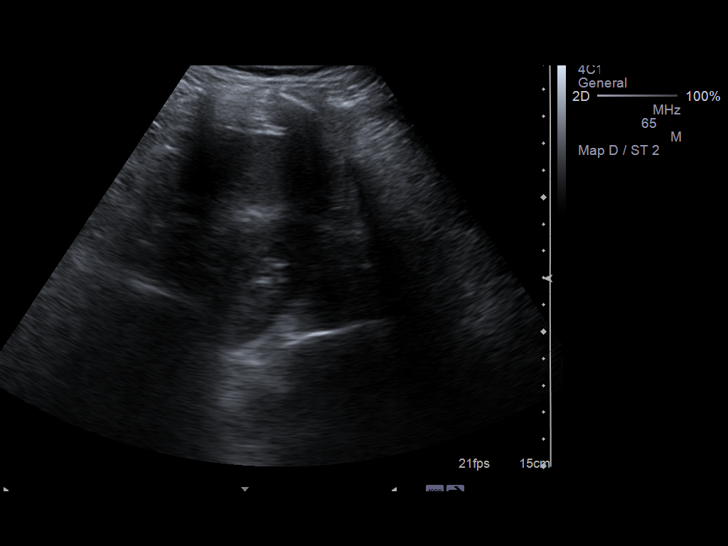
[im 77/84]
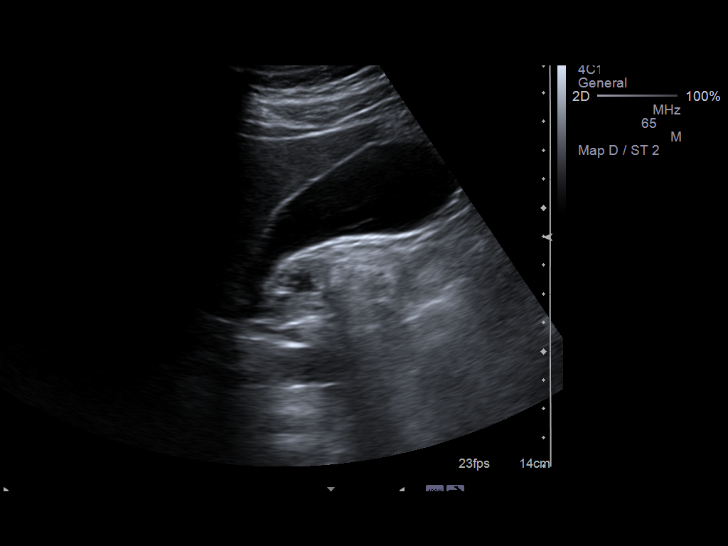
[im 84/84]
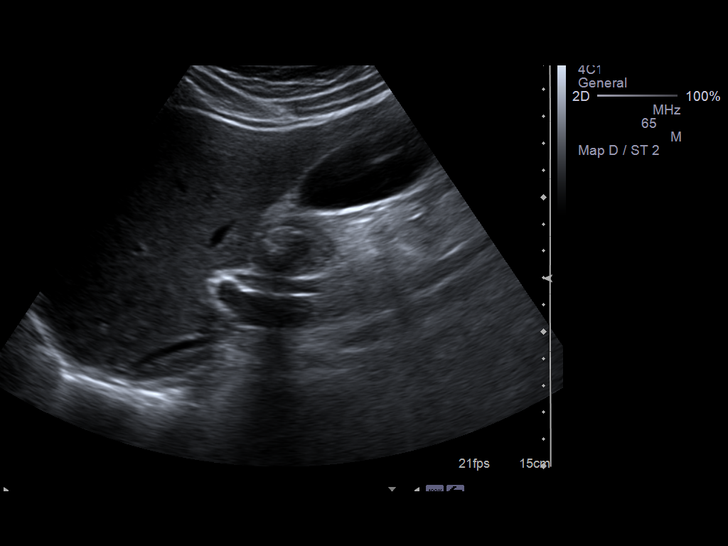

[14 of 25 positions shown; findings below may reference images not displayed]

FINDINGS: Gallbladder:  No gallstones, gallbladder wall thickening, or
pericholecystic fluid.

Common bile duct:   Normal caliber, 5 mm.

Liver:  No focal lesion identified.  Within normal limits in
parenchymal echogenicity.

IVC:  Appears normal.

Pancreas:  No focal abnormality seen.

Spleen:  Within normal limits in size and echotexture.

Right Kidney:   9 mm cyst in the lower pole.  No hydronephrosis.
Normal echotexture.

Left Kidney:  Normal in size and parenchymal echogenicity.  No
evidence of mass or hydronephrosis.

Abdominal aorta:  No aneurysm identified.
IMPRESSION: No acute findings.

## 2016-05-31 ENCOUNTER — Encounter (HOSPITAL_BASED_OUTPATIENT_CLINIC_OR_DEPARTMENT_OTHER): Payer: Self-pay | Admitting: *Deleted

## 2016-05-31 ENCOUNTER — Emergency Department (HOSPITAL_BASED_OUTPATIENT_CLINIC_OR_DEPARTMENT_OTHER): Payer: Medicaid Other

## 2016-05-31 ENCOUNTER — Emergency Department (HOSPITAL_BASED_OUTPATIENT_CLINIC_OR_DEPARTMENT_OTHER)
Admission: EM | Admit: 2016-05-31 | Discharge: 2016-05-31 | Discharge status: Against Medical Advice | Payer: Medicaid Other | Attending: Emergency Medicine | Admitting: Emergency Medicine

## 2016-05-31 DIAGNOSIS — Z87891 Personal history of nicotine dependence: Secondary | ICD-10-CM | POA: Insufficient documentation

## 2016-05-31 DIAGNOSIS — I10 Essential (primary) hypertension: Secondary | ICD-10-CM | POA: Diagnosis not present

## 2016-05-31 DIAGNOSIS — R4781 Slurred speech: Secondary | ICD-10-CM

## 2016-05-31 DIAGNOSIS — Z79899 Other long term (current) drug therapy: Secondary | ICD-10-CM | POA: Diagnosis not present

## 2016-05-31 DIAGNOSIS — R270 Ataxia, unspecified: Secondary | ICD-10-CM | POA: Diagnosis not present

## 2016-05-31 DIAGNOSIS — Z7982 Long term (current) use of aspirin: Secondary | ICD-10-CM | POA: Diagnosis not present

## 2016-05-31 DIAGNOSIS — I2510 Atherosclerotic heart disease of native coronary artery without angina pectoris: Secondary | ICD-10-CM | POA: Insufficient documentation

## 2016-05-31 HISTORY — DX: Hyperlipidemia, unspecified: E78.5

## 2016-05-31 LAB — COMPREHENSIVE METABOLIC PANEL
ALBUMIN: 3.9 g/dL (ref 3.5–5.0)
ALK PHOS: 76 U/L (ref 38–126)
ALT: 21 U/L (ref 17–63)
AST: 21 U/L (ref 15–41)
Anion gap: 9 (ref 5–15)
BUN: 9 mg/dL (ref 6–20)
CHLORIDE: 106 mmol/L (ref 101–111)
CO2: 28 mmol/L (ref 22–32)
CREATININE: 1.18 mg/dL (ref 0.61–1.24)
Calcium: 8.6 mg/dL — ABNORMAL LOW (ref 8.9–10.3)
GFR calc non Af Amer: >60 mL/min (ref >60)
GLUCOSE: 119 mg/dL — AB (ref 65–99)
Potassium: 2.9 mmol/L — ABNORMAL LOW (ref 3.5–5.1)
SODIUM: 143 mmol/L (ref 135–145)
Total Bilirubin: 0.6 mg/dL (ref 0.3–1.2)
Total Protein: 7.1 g/dL (ref 6.5–8.1)

## 2016-05-31 LAB — CBC WITH DIFFERENTIAL/PLATELET
Basophils Absolute: 0 10*3/uL (ref 0.0–0.1)
Basophils Relative: 0 %
EOS PCT: 3 %
Eosinophils Absolute: 0.1 10*3/uL (ref 0.0–0.7)
HCT: 44.1 % (ref 39.0–52.0)
HEMOGLOBIN: 15.4 g/dL (ref 13.0–17.0)
LYMPHS ABS: 1.5 10*3/uL (ref 0.7–4.0)
Lymphocytes Relative: 38 %
MCH: 30.9 pg (ref 26.0–34.0)
MCHC: 34.9 g/dL (ref 30.0–36.0)
MCV: 88.4 fL (ref 78.0–100.0)
Monocytes Absolute: 0.3 10*3/uL (ref 0.1–1.0)
Monocytes Relative: 7 %
NEUTROS PCT: 51 %
Neutro Abs: 2 10*3/uL (ref 1.7–7.7)
PLATELETS: 111 10*3/uL — AB (ref 150–400)
RBC: 4.99 MIL/uL (ref 4.22–5.81)
RDW: 14.3 % (ref 11.5–15.5)
WBC: 3.9 10*3/uL — ABNORMAL LOW (ref 4.0–10.5)

## 2016-05-31 NOTE — ED Notes (Signed)
Patient transported to CT 

## 2016-05-31 NOTE — ED Provider Notes (Signed)
[facility] Provider Note   CSN: [id number] Arrival date & time: 05/31/16  1534   By signing my name below, I, Teofilo Pod, attest that this documentation has been prepared under the direction and in the presence of Geoffery Lyons, MD . Electronically Signed: Teofilo Pod, ED Scribe. 05/31/2016. 4:15 PM.   History   Chief Complaint Chief Complaint  Patient presents with  . Aphasia   The history is provided by the patient. No language interpreter was used.   HPI Comments:  Nicholas Coffey is a 61 y.o. male who presents to the Emergency Department complaining of a worsening speech difficulty since yesterday. Daughter reports that since yesterday pt has had slurred speech and has been losing his balance when walking. Pt was seen by his PCP today and was referred here for further evaluation due to concern for a stroke. Pt states that he had a change in his HTN medication last week and the symptoms started after changing the medication. 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 Denies any EtOH or illicit drug use. Denies dizziness.     Past Medical History:  Diagnosis Date  . Coronary artery disease   . Hyperlipidemia   . Hypertension     There are no active problems to display for this patient.   Past Surgical History:  Procedure Laterality Date  . VENTRICULOPERITONEAL SHUNT         Home Medications    Prior to Admission medications   Medication Sig Start Date End Date Taking? Authorizing Provider  aspirin 325 MG tablet Take 325 mg by mouth daily.     Yes Historical Provider, MD  lisinopril (PRINIVIL,ZESTRIL) 10 MG tablet Take 10 mg by mouth daily.     Yes Historical Provider, MD  sulfamethoxazole-trimethoprim (BACTRIM DS) 800-160 MG per tablet Take 1 tablet by mouth 2 (two) times daily.      Historical Provider, MD    Family History No family history on file.  Social History Social  History  Substance Use Topics  . Smoking status: Former Games developer  . Smokeless tobacco: Never Used  . Alcohol use No     Comment: none currently     Allergies   Codeine   Review of Systems Review of Systems  Musculoskeletal: Positive for gait problem.  Neurological: Positive for speech difficulty. Negative for dizziness.  All other systems reviewed and are negative.    Physical Exam Updated Vital Signs BP (!) 161/87 (BP Location: Left Arm)   Pulse (!) 48   Temp 98.2 F (36.8 C) (Oral)   Resp (!) 22   Ht  (1.778 m)   Wt 187 lb (84.8 kg)   SpO2 97%   BMI 26.83 kg/m   Physical Exam  Constitutional: He is oriented to person, place, and time. He appears well-developed and well-nourished.  HENT:  Head: Normocephalic and atraumatic.  Mouth/Throat: Oropharynx is clear and moist.  Eyes: EOM are normal. Pupils are equal, round, and reactive to light.  Neck: Normal range of motion.  Cardiovascular: Normal rate, regular rhythm, normal heart sounds and intact distal pulses.   No murmur heard. Pulmonary/Chest: Effort normal and breath sounds normal. No respiratory distress. He has no wheezes. He has no rales.  Abdominal: Soft. Bowel sounds are normal. He exhibits no distension. There is no tenderness.  Musculoskeletal: Normal range of motion.  Lymphadenopathy:    He has no cervical adenopathy.  Neurological: He  is alert and oriented to person, place, and time. No cranial nerve deficit. He exhibits normal muscle tone. Coordination normal.  Speech somewhat slurred and slowed.   Skin: Skin is warm and dry.  Psychiatric: He has a normal mood and affect. Judgment normal.  Nursing note and vitals reviewed.    ED Treatments / Results  DIAGNOSTIC STUDIES:  Oxygen Saturation is 97% on RA, normal by my interpretation.    COORDINATION OF CARE:  4:13 PM Will order CT and blood work. Discussed treatment plan with pt at bedside and pt agreed to plan.   Labs (all labs ordered  are listed, but only abnormal results are displayed) Labs Reviewed - No data to display  EKG  EKG Interpretation None       Radiology No results found.  Procedures Procedures (including critical care time)  Medications Ordered in ED Medications - No data to display   Initial Impression / Assessment and Plan / ED Course  I have reviewed the triage vital signs and the nursing notes.  Pertinent labs & imaging results that were available during my care of the patient were reviewed by me and considered in my medical decision making (see chart for details).  Patient with new neurologic findings including slurred speech and unstable gait that started yesterday. His head CT is not exclusive of an acute stroke. I've discussed this case with Dr. Roxy Manns from neurology who is recommending an MRI to further evaluate. I've spoken with Dr. Clarene Duke at the [facility] who excepts the patient in transfer.  Final Clinical Impressions(s) / ED Diagnoses   Final diagnoses:  None    New Prescriptions New Prescriptions   No medications on file  I personally performed the services described in this documentation, which was scribed in my presence. The recorded information has been reviewed and is accurate.        Geoffery Lyons, MD 05/31/16 [phone]

## 2016-05-31 NOTE — ED Notes (Signed)
ED Provider at bedside. 

## 2016-05-31 NOTE — ED Notes (Signed)
Pt states he doesn't want to be transferred to [facility]. States he wants to go home. Dr. Judd Lien at bedside talking with pt and family.

## 2016-05-31 NOTE — ED Notes (Signed)
Disposition AMA per MD verbal order.

## 2016-05-31 NOTE — ED Triage Notes (Signed)
Family member reports pt had slurred speech yesterday which improved and then worsened today. Also pt c/o of dizziness today since 0900. Pt seen at PCP office and sent here for further evaluation

## 2016-10-21 DIAGNOSIS — D696 Thrombocytopenia, unspecified: Secondary | ICD-10-CM | POA: Insufficient documentation

## 2016-10-22 DIAGNOSIS — E538 Deficiency of other specified B group vitamins: Secondary | ICD-10-CM | POA: Insufficient documentation

## 2016-11-11 ENCOUNTER — Emergency Department (HOSPITAL_BASED_OUTPATIENT_CLINIC_OR_DEPARTMENT_OTHER)
Admission: EM | Admit: 2016-11-11 | Discharge: 2016-11-11 | Disposition: A | Discharge status: Home / Self Care | Payer: Medicaid Other | Attending: Emergency Medicine | Admitting: Emergency Medicine

## 2016-11-11 ENCOUNTER — Emergency Department (HOSPITAL_BASED_OUTPATIENT_CLINIC_OR_DEPARTMENT_OTHER): Payer: Medicaid Other

## 2016-11-11 ENCOUNTER — Encounter (HOSPITAL_BASED_OUTPATIENT_CLINIC_OR_DEPARTMENT_OTHER): Payer: Self-pay | Admitting: *Deleted

## 2016-11-11 DIAGNOSIS — I2510 Atherosclerotic heart disease of native coronary artery without angina pectoris: Secondary | ICD-10-CM | POA: Diagnosis not present

## 2016-11-11 DIAGNOSIS — Z79899 Other long term (current) drug therapy: Secondary | ICD-10-CM | POA: Insufficient documentation

## 2016-11-11 DIAGNOSIS — I10 Essential (primary) hypertension: Secondary | ICD-10-CM | POA: Insufficient documentation

## 2016-11-11 DIAGNOSIS — M25561 Pain in right knee: Secondary | ICD-10-CM | POA: Diagnosis not present

## 2016-11-11 DIAGNOSIS — Z87891 Personal history of nicotine dependence: Secondary | ICD-10-CM | POA: Diagnosis not present

## 2016-11-11 NOTE — ED Triage Notes (Signed)
Pt is here for right knee pain x1 week.  Pain has been increasing.  Pt reports pain increased when walking and that knee has some swelling.  Not associated with any trauma.

## 2016-11-11 NOTE — ED Notes (Signed)
ED Provider at bedside. 

## 2016-11-11 NOTE — ED Provider Notes (Signed)
[facility]   CSN: [id number] Arrival date & time: 11/11/16  1836     History   Chief Complaint Chief Complaint  Patient presents with  . Knee Pain    HPI Nicholas Coffey is a 61 y.o. male.  HPI Patient with right knee pain. States he had an injury 17 years ago and since has had some pain over the last week has been more painful. No new trauma. Has some swelling. No new injury. No fevers or chills. No chest pain. No trouble breathing. No pain in his ankle or hip.   Past Medical History:  Diagnosis Date  . Coronary artery disease   . Hyperlipidemia   . Hypertension     There are no active problems to display for this patient.   Past Surgical History:  Procedure Laterality Date  . VENTRICULOPERITONEAL SHUNT         Home Medications    Prior to Admission medications   Medication Sig Start Date End Date Taking? Authorizing Provider  amLODipine (NORVASC) 10 MG tablet Take 10 mg by mouth daily.   Yes [provider]  aspirin 325 MG tablet Take 325 mg by mouth daily.      [provider]  lisinopril (PRINIVIL,ZESTRIL) 10 MG tablet Take 10 mg by mouth daily.      [provider]  sulfamethoxazole-trimethoprim (BACTRIM DS) 800-160 MG per tablet Take 1 tablet by mouth 2 (two) times daily.      [provider]    Family History No family history on file.  Social History Social History  Substance Use Topics  . Smoking status: Former Games developer  . Smokeless tobacco: Never Used  . Alcohol use No     Comment: none currently     Allergies   Codeine   Review of Systems Review of Systems  Constitutional: Negative for appetite change and fever.  Respiratory: Negative for stridor.   Gastrointestinal: Negative for abdominal distention.  Musculoskeletal: Negative for back pain.       Right knee pain  Neurological: Negative for weakness and numbness.  Psychiatric/Behavioral: Negative for confusion.      Physical Exam Updated Vital Signs BP (!) 164/102 (BP Location: Right Arm)   Pulse 66   Temp 98.2 F (36.8 C) (Oral)   Resp 18   Ht  (1.778 m)   Wt 81.6 kg (180 lb)   SpO2 98%   BMI 25.83 kg/m   Physical Exam  Constitutional: He appears well-developed.  Cardiovascular: Normal rate.   Pulmonary/Chest: Effort normal.  Musculoskeletal:  Mild effusion right knee. Some laxity with varus strain on the knee. No erythema. Good range of motion. Neurovascularly intact distally. No tenderness over the ankle or hip. Good flexion and extension at the knee.  Neurological: He is alert.  Skin: Skin is warm.     ED Treatments / Results  Labs (all labs ordered are listed, but only abnormal results are displayed) Labs Reviewed - No data to display  EKG  EKG Interpretation None       Radiology Dg Knee Complete 4 Views Right  Result Date: 11/11/2016 CLINICAL DATA:  No known recent injury. Right knee swelling and pain x1 week with weight-bearing. Remote fall 17 years ago. EXAM: RIGHT KNEE - COMPLETE 4+ VIEW COMPARISON:  None. FINDINGS: Small suprapatellar joint effusion. Mild femorotibial joint space narrowing. No acute displaced appearing fracture is identified. Slight subchondral sclerosis along the medial tibial plateau may reflect history of old  remote trauma and chronic mild compression. IMPRESSION: 1. Small suprapatellar joint effusion. 2. Degenerative joint space narrowing of the femorotibial compartment. 3. No acute appearing fracture is identified nor joint dislocation noted. No loose bodies. Electronically Signed   By: Tollie Eth M.D.   On: 11/11/2016 20:07    Procedures Procedures (including critical care time)  Medications Ordered in ED Medications - No data to display   Initial Impression / Assessment and Plan / ED Course  I have reviewed the triage vital signs and the nursing notes.  Pertinent labs & imaging results that were available during my care of the  patient were reviewed by me and considered in my medical decision making (see chart for details).     Patient with knee pain. Has had for last week. X-ray shows arthritis but otherwise reassuring. Doubt infection or other severe injury. Discharge home follow-up with sports medicine as needed.  Final Clinical Impressions(s) / ED Diagnoses   Final diagnoses:  Acute pain of right knee    New Prescriptions New Prescriptions   No medications on file     Benjiman Core, MD 11/11/16 2033

## 2019-06-16 ENCOUNTER — Emergency Department (HOSPITAL_BASED_OUTPATIENT_CLINIC_OR_DEPARTMENT_OTHER)
Admission: EM | Admit: 2019-06-16 | Discharge: 2019-06-16 | Disposition: A | Discharge status: Home / Self Care | Payer: Medicaid Other | Attending: Emergency Medicine | Admitting: Emergency Medicine

## 2019-06-16 ENCOUNTER — Other Ambulatory Visit: Payer: Self-pay

## 2019-06-16 ENCOUNTER — Encounter (HOSPITAL_BASED_OUTPATIENT_CLINIC_OR_DEPARTMENT_OTHER): Payer: Self-pay | Admitting: Emergency Medicine

## 2019-06-16 DIAGNOSIS — Y9389 Activity, other specified: Secondary | ICD-10-CM | POA: Insufficient documentation

## 2019-06-16 DIAGNOSIS — Z79899 Other long term (current) drug therapy: Secondary | ICD-10-CM | POA: Insufficient documentation

## 2019-06-16 DIAGNOSIS — Z7982 Long term (current) use of aspirin: Secondary | ICD-10-CM | POA: Insufficient documentation

## 2019-06-16 DIAGNOSIS — W208XXA Other cause of strike by thrown, projected or falling object, initial encounter: Secondary | ICD-10-CM | POA: Diagnosis not present

## 2019-06-16 DIAGNOSIS — H2000 Unspecified acute and subacute iridocyclitis: Secondary | ICD-10-CM | POA: Insufficient documentation

## 2019-06-16 DIAGNOSIS — I10 Essential (primary) hypertension: Secondary | ICD-10-CM | POA: Diagnosis not present

## 2019-06-16 DIAGNOSIS — I259 Chronic ischemic heart disease, unspecified: Secondary | ICD-10-CM | POA: Insufficient documentation

## 2019-06-16 DIAGNOSIS — Y9289 Other specified places as the place of occurrence of the external cause: Secondary | ICD-10-CM | POA: Insufficient documentation

## 2019-06-16 DIAGNOSIS — Z87891 Personal history of nicotine dependence: Secondary | ICD-10-CM | POA: Insufficient documentation

## 2019-06-16 DIAGNOSIS — H209 Unspecified iridocyclitis: Secondary | ICD-10-CM

## 2019-06-16 DIAGNOSIS — Y999 Unspecified external cause status: Secondary | ICD-10-CM | POA: Insufficient documentation

## 2019-06-16 DIAGNOSIS — H5711 Ocular pain, right eye: Secondary | ICD-10-CM | POA: Diagnosis present

## 2019-06-16 MED ORDER — HOMATROPINE HBR 5 % OP SOLN: 1.0000 [drp] | Freq: Two times a day (BID) | OPHTHALMIC | 0 refills | Status: DC

## 2019-06-16 MED ORDER — HYDROCODONE-ACETAMINOPHEN 5-325 MG PO TABS: 1.0000 | ORAL_TABLET | ORAL | 0 refills | Status: DC | PRN

## 2019-06-16 MED ORDER — TETRACAINE HCL 0.5 % OP SOLN
2.0000 [drp] | Freq: Once | OPHTHALMIC | Status: AC
  Administered 2019-06-16: 15:00:00 2 [drp] via OPHTHALMIC
  Filled 2019-06-16: qty 4

## 2019-06-16 MED ORDER — FLUORESCEIN SODIUM 1 MG OP STRP
1.0000 | ORAL_STRIP | Freq: Once | OPHTHALMIC | Status: AC
  Administered 2019-06-16: 1 via OPHTHALMIC
  Filled 2019-06-16: qty 1

## 2019-06-16 NOTE — Discharge Instructions (Addendum)
Use drops to eyes twice daily. Take Norco as needed for eye pain. This can cause constipation, take Colace as needed. Follow up with ophthalmology, referral given, call to schedule appointment on Monday morning.

## 2019-06-16 NOTE — ED Notes (Signed)
Both, right eye and left eye all read to 20/40, no eyewear.
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
## 2019-06-19 ENCOUNTER — Telehealth (HOSPITAL_BASED_OUTPATIENT_CLINIC_OR_DEPARTMENT_OTHER): Payer: Self-pay | Admitting: Emergency Medicine

## 2019-11-21 DIAGNOSIS — M533 Sacrococcygeal disorders, not elsewhere classified: Secondary | ICD-10-CM | POA: Insufficient documentation

## 2020-11-28 ENCOUNTER — Other Ambulatory Visit: Payer: Self-pay

## 2020-12-20 ENCOUNTER — Other Ambulatory Visit: Payer: Self-pay

## 2020-12-20 DIAGNOSIS — I872 Venous insufficiency (chronic) (peripheral): Secondary | ICD-10-CM
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
## 2021-01-02 ENCOUNTER — Other Ambulatory Visit: Payer: Self-pay

## 2021-01-02 ENCOUNTER — Ambulatory Visit (HOSPITAL_COMMUNITY)
Admission: RE | Admit: 2021-01-02 | Discharge: 2021-01-02 | Disposition: A | Discharge status: Home / Self Care | Payer: Medicare Other | Source: Ambulatory Visit | Attending: Vascular Surgery | Admitting: Vascular Surgery

## 2021-01-02 ENCOUNTER — Encounter: Payer: Self-pay | Admitting: Vascular Surgery

## 2021-01-02 ENCOUNTER — Ambulatory Visit (INDEPENDENT_AMBULATORY_CARE_PROVIDER_SITE_OTHER): Payer: Medicare Other | Admitting: Vascular Surgery

## 2021-01-02 VITALS — BP 161/90 | HR 53 | Temp 98.3°F | Resp 20 | Ht 69.0 in | Wt 199.0 lb

## 2021-01-02 DIAGNOSIS — R600 Localized edema: Secondary | ICD-10-CM | POA: Diagnosis not present

## 2021-01-02 DIAGNOSIS — I872 Venous insufficiency (chronic) (peripheral): Secondary | ICD-10-CM

## 2021-01-02 DIAGNOSIS — M7989 Other specified soft tissue disorders: Secondary | ICD-10-CM

## 2021-03-12 ENCOUNTER — Encounter: Payer: Self-pay | Admitting: Gastroenterology

## 2021-03-19 ENCOUNTER — Telehealth: Payer: Self-pay | Admitting: *Deleted

## 2021-03-19 NOTE — Telephone Encounter (Signed)
Please review chart and advise if ok to do in [facility].  THANK YOU

## 2021-04-06 ENCOUNTER — Other Ambulatory Visit: Payer: Self-pay

## 2021-04-06 ENCOUNTER — Ambulatory Visit (AMBULATORY_SURGERY_CENTER): Payer: Medicare Other | Admitting: *Deleted

## 2021-04-06 VITALS — Ht 69.0 in | Wt 199.0 lb

## 2021-04-06 DIAGNOSIS — Z1211 Encounter for screening for malignant neoplasm of colon: Secondary | ICD-10-CM

## 2021-04-06 MED ORDER — PLENVU 140 G PO SOLR
1.0000 | Freq: Once | ORAL | 0 refills | Status: AC
Start: 2021-04-06 — End: 2021-04-06

## 2021-04-06 NOTE — Progress Notes (Signed)
Patient's pre-visit was done today over the phone with the patient. Name,DOB and address verified. Patient denies any allergies to Eggs and Soy. Patient denies any problems with anesthesia/sedation. Patient is not taking any diet pills or blood thinners. No home Oxygen.   Rescheduled colonoscopy to a Monday per pt's request. Notified daughter of new appointment time/date, daughter will be with patient. Prep instructions mailed to pt at daughter's house per pt request-pt & daughter aware. Patient understands to call us back with any questions or concerns. Patient is aware of our care-partner policy and 0000000 safety protocol.

## 2021-04-15 ENCOUNTER — Encounter: Payer: Medicare Other | Admitting: Gastroenterology

## 2021-04-22 ENCOUNTER — Telehealth: Payer: Self-pay | Admitting: Gastroenterology

## 2021-04-22 DIAGNOSIS — Z1211 Encounter for screening for malignant neoplasm of colon: Secondary | ICD-10-CM

## 2021-04-22 MED ORDER — PEG 3350-KCL-NA BICARB-NACL 420 G PO SOLR: 4000.0000 mL | Freq: Once | ORAL | 0 refills | Status: AC

## 2021-04-22 NOTE — Telephone Encounter (Signed)
Colo guard ordered by Emilee Hero-  instructed daughter to call this office for results of colo guard ? ?4-17 colon-  1130 am -  danis- changed prep to golytely as plenvu $200  ? ?New instructions to daughter Gregary Signs as pt did not tell her he even had PV 2-20- jukai to call with further questions  ? ? ?

## 2021-04-22 NOTE — Telephone Encounter (Signed)
Patients daughter called and stated that patient had take a at home colorguard and they have not received the results yet. Patient had a PV on 2/20 and procedure was canceled for 3/1 and rescheduled for 4/17. Daughter stated that patient was not wanting to have procedure and he was given the option of the colorguard, however I do not see any documentation on that information. Please advise.   ?

## 2021-05-16 ENCOUNTER — Encounter (HOSPITAL_BASED_OUTPATIENT_CLINIC_OR_DEPARTMENT_OTHER): Payer: Self-pay

## 2021-05-16 ENCOUNTER — Emergency Department (HOSPITAL_BASED_OUTPATIENT_CLINIC_OR_DEPARTMENT_OTHER): Payer: Medicare Other

## 2021-05-16 ENCOUNTER — Other Ambulatory Visit: Payer: Self-pay

## 2021-05-16 ENCOUNTER — Emergency Department (HOSPITAL_BASED_OUTPATIENT_CLINIC_OR_DEPARTMENT_OTHER)
Admission: EM | Admit: 2021-05-16 | Discharge: 2021-05-16 | Disposition: A | Discharge status: Home / Self Care | Payer: Medicare Other | Attending: Emergency Medicine | Admitting: Emergency Medicine

## 2021-05-16 DIAGNOSIS — R519 Headache, unspecified: Secondary | ICD-10-CM

## 2021-05-16 MED ORDER — PROCHLORPERAZINE EDISYLATE 10 MG/2ML IJ SOLN
10.0000 mg | Freq: Once | INTRAMUSCULAR | Status: AC
  Administered 2021-05-16: 10 mg via INTRAVENOUS
  Filled 2021-05-16: qty 2

## 2021-05-16 MED ORDER — DIPHENHYDRAMINE HCL 50 MG/ML IJ SOLN
25.0000 mg | Freq: Once | INTRAMUSCULAR | Status: AC
  Administered 2021-05-16: 25 mg via INTRAVENOUS
  Filled 2021-05-16: qty 1

## 2021-05-16 NOTE — Discharge Instructions (Signed)
Call your neurosurgeon on Monday and let them know that you are having a headache and see if they would see you in the office.  Please return for worsening fever or confusion. ?

## 2021-05-16 NOTE — ED Triage Notes (Signed)
Pt states awoke with headache this morning, top of head.  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 Denies n/v, denies blurry vision.  Ambulated to triage room with steady gait.  Hx HTN, high cholesterol ?

## 2021-05-16 NOTE — ED Provider Notes (Signed)
?[facility] EMERGENCY DEPARTMENT ?Provider Note ? ? ?CSN: [id number] ?Arrival date & time: 05/16/21  1923 ? ?  ? ?History ? ?Chief Complaint  ?Patient presents with  ? Headache  ? ? ?Nicholas Coffey is a 66 y.o. male. ? ?66 yo M with a chief complaint of a headache.  He has a history of VP shunt placement.  Has a history of chronic headaches as well.  He thinks his headache feels somewhat different.  He feels like it is to the top of his head.  Denies trauma denies fevers denies neck pain.  He denies one-sided numbness or weakness denies difficulty speech or swallowing denies nausea or vomiting. ? ? ?Headache ? ?  ? ?Home Medications ?Prior to Admission medications   ?Medication Sig Start Date End Date Taking? Authorizing Provider  ?amLODipine-benazepril (LOTREL) 5-40 MG capsule Take 1 capsule by mouth daily. 09/19/20   [provider]  ?atorvastatin (LIPITOR) 40 MG tablet Take 40 mg by mouth daily. 09/24/20   [provider]  ?butalbital-acetaminophen-caffeine (FIORICET) 50-325-40 MG tablet SMARTSIG:1 Tablet(s) By Mouth 1-3 Times Daily PRN 11/25/20   [provider]  ?furosemide (LASIX) 20 MG tablet Take 20 mg by mouth 2 (two) times daily. 01/25/19   [provider]  ?   ? ?Allergies    ?Codeine   ? ?Review of Systems   ?Review of Systems  ?Neurological:  Positive for headaches.  ? ?Physical Exam ?Updated Vital Signs ?BP (!) 150/84 (BP Location: Right Arm)   Pulse (!) 52   Temp 98 ?F (36.7 ?C) (Oral)   Resp (!) 23   Ht 5\' 9"  (1.753 m)   Wt 90.7 kg   SpO2 98%   BMI 29.53 kg/m?  ?Physical Exam ?Vitals and nursing note reviewed.  ?Constitutional:   ?   Appearance: He is well-developed.  ?HENT:  ?   Head: Normocephalic and atraumatic.  ?   Comments: No obvious abnormality to the scalp ?Eyes:  ?   Pupils: Pupils are equal, round, and reactive to light.  ?Neck:  ?   Vascular: No JVD.  ?Cardiovascular:  ?   Rate and Rhythm: Normal rate and regular rhythm.  ?   Heart sounds:  No murmur heard. ?  No friction rub. No gallop.  ?Pulmonary:  ?   Effort: No respiratory distress.  ?   Breath sounds: No wheezing.  ?Abdominal:  ?   General: There is no distension.  ?   Tenderness: There is no abdominal tenderness. There is no guarding or rebound.  ?Musculoskeletal:     ?   General: Normal range of motion.  ?   Cervical back: Normal range of motion and neck supple.  ?Skin: ?   Coloration: Skin is not pale.  ?   Findings: No rash.  ?Neurological:  ?   Mental Status: He is alert and oriented to person, place, and time.  ?   Cranial Nerves: Cranial nerves 2-12 are intact.  ?   Sensory: Sensation is intact.  ?   Motor: Motor function is intact.  ?   Coordination: Coordination is intact.  ?   Comments: Benign neurologic exam.  ?Psychiatric:     ?   Behavior: Behavior normal.  ? ? ?ED Results / Procedures / Treatments   ?Labs ?(all labs ordered are listed, but only abnormal results are displayed) ?Labs Reviewed - No data to display ? ?EKG ?None ? ?Radiology ?CT Head Wo Contrast ? ?Result Date: 05/16/2021 ?CLINICAL DATA:  Headache, new or worsening (Age >= 50y) EXAM: CT HEAD WITHOUT CONTRAST TECHNIQUE: Contiguous axial images were obtained from the base of the skull through the vertex without intravenous contrast. RADIATION DOSE REDUCTION: This exam was performed according to the departmental dose-optimization program which includes automated exposure control, adjustment of the mA and/or kV according to patient size and/or use of iterative reconstruction technique. COMPARISON:  05/31/2016 FINDINGS: Brain: Old left cerebellar infarct, stable. Right parietal ventriculoperitoneal shunt remains in place. The tip terminates in the medial right frontal lobe, stable since prior study. No hydrocephalus. Multiple old bilateral basal ganglia and thalamic lacunar infarcts, stable. There is atrophy and chronic small vessel disease changes. Vascular: No hyperdense vessel or unexpected calcification. Skull: No acute  calvarial abnormality. Sinuses/Orbits: No acute findings Other: None IMPRESSION: Right parietal VP shunt remains in place, unchanged. No hydrocephalus. Atrophy, chronic small vessel disease. 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 Old left cerebellar infarct. No acute intracranial abnormality. Electronically Signed   By: Charlett Nose M.D.   On: 05/16/2021 21:23   ? ?Procedures ?Procedures  ? ? ?Medications Ordered in ED ?Medications  ?prochlorperazine (COMPAZINE) injection 10 mg (10 mg Intravenous Given 05/16/21 2057)  ?diphenhydrAMINE (BENADRYL) injection 25 mg (25 mg Intravenous Given 05/16/21 2049)  ? ? ?ED Course/ Medical Decision Making/ A&P ?  ?                        ?Medical Decision Making ?Amount and/or Complexity of Data Reviewed ?Radiology: ordered. ? ?Risk ?Prescription drug management. ? ? ?66 yo M with a chief complaints of headache.  This started this morning.  Feels different than his typical headaches.  Has a history of a VP shunt.  Has a benign neurologic exam.  We will obtain a CT scan to evaluate for shunt dysfunction.  Treat with a headache cocktail.  Reassess. ? ?CT scan of the head without obvious acute intracranial pathology.  VP shunt appears to be in a good position.  No ventriculomegaly.  Patient feeling better on reassessment.  Will discharge home.  PCP follow-up. ? ?9:32 PM:  I have discussed the diagnosis/risks/treatment options with the patient.  Evaluation and diagnostic testing in the emergency department does not suggest an emergent condition requiring admission or immediate intervention beyond what has been performed at this time.  They will follow up with  PCP. We also discussed returning to the ED immediately if new or worsening sx occur. We discussed the sx which are most concerning (e.g., sudden worsening pain, fever, inability to tolerate by mouth) that necessitate immediate return. Medications administered to the patient during their visit and  any new prescriptions provided to the patient are listed below. ? ?Medications given during this visit ?Medications  ?prochlorperazine (COMPAZINE) injection 10 mg (10 mg Intravenous Given 05/16/21 2057)  ?diphenhydrAMINE (BENADRYL) injection 25 mg (25 mg Intravenous Given 05/16/21 2049)  ? ? ? ?The patient appears reasonably screen and/or stabilized for discharge and I doubt any other medical condition or other [facility] requiring further screening, evaluation, or treatment in the ED at this time prior to discharge.  ? ? ? ? ? ? ? ? ?Final Clinical Impression(s) / ED Diagnoses ?Final diagnoses:  ?Bad headache  ? ? ?Rx / DC Orders ?ED Discharge Orders   ? ? None  ? ?  ? ? ?  ?Melene Plan, DO ?05/16/21 2132 ? ?

## 2021-05-28 ENCOUNTER — Telehealth: Payer: Self-pay | Admitting: Gastroenterology

## 2021-05-28 DIAGNOSIS — Z1211 Encounter for screening for malignant neoplasm of colon: Secondary | ICD-10-CM

## 2021-05-28 MED ORDER — PEG 3350-KCL-NA BICARB-NACL 420 G PO SOLR: 4000.0000 mL | Freq: Once | ORAL | 0 refills | Status: AC

## 2021-05-28 MED ORDER — BISACODYL 5 MG PO TBEC: 5.0000 mg | DELAYED_RELEASE_TABLET | Freq: Every day | ORAL | 0 refills | Status: AC | PRN

## 2021-05-28 NOTE — Telephone Encounter (Signed)
Nicholas Coffey, daughter ? ?Will send in script for Golytely and Dulcolax tabs ?Sent printed instructions to temporary address ?[place]  ? ?Also emailed with no personal info to [place] at  ?[email or web address]  ?

## 2021-05-28 NOTE — Telephone Encounter (Signed)
Hey Dr. Myrtie Neither,  ? ?Patient daughter called in to cancel procedure patient procedure for 2022-06-30 due to death in the family and funeral that day. Patient rescheduled for 4/25.  ? ?Thank you ?

## 2021-05-28 NOTE — Telephone Encounter (Signed)
Inbound call from patient daughter. Reports prep for the procedure is too expensive and would need an alternative. Also, never received prep instructions in the mail. Would like the instructions sent to the temp address ?

## 2021-06-01 ENCOUNTER — Encounter: Payer: Medicare Other | Admitting: Gastroenterology

## 2021-06-09 ENCOUNTER — Telehealth: Payer: Self-pay | Admitting: Gastroenterology

## 2021-06-09 ENCOUNTER — Encounter: Payer: Medicare Other | Admitting: Gastroenterology

## 2021-06-09 NOTE — Telephone Encounter (Signed)
That is a same-day cancel leading to a wasted procedure slot. ? ?No-show fees cannot be assessed for medicare patients, however I will offer only one chance to reschedule. ? ?- HD ?

## 2021-09-17 ENCOUNTER — Other Ambulatory Visit: Payer: Self-pay | Admitting: *Deleted

## 2021-09-17 NOTE — Patient Outreach (Signed)
  Care Coordination   09/17/2021 Name: Nicholas Coffey MRN: [id number] DOB: 02-Apr-1955   Care Coordination Outreach Attempts:  An unsuccessful telephone outreach was attempted today to offer the patient information about available care coordination services as a benefit of their health plan.   Follow Up Plan:  Additional outreach attempts will be made to offer the patient care coordination information and services.   Encounter Outcome:  No Answer  Care Coordination Interventions Activated:  No   Care Coordination Interventions:  No, not indicated    SIG Daniyla Pfahler C. Burgess Estelle, MSN, [facility] Gerontological Nurse Practitioner [facility] Care Management [phone]

## 2021-09-24 ENCOUNTER — Ambulatory Visit: Payer: Self-pay | Admitting: *Deleted

## 2021-09-24 NOTE — Patient Outreach (Signed)
  Care Coordination   Initial Visit Note   09/24/2021 Name: Nicholas Coffey MRN: [id number] DOB: 1956/01/21  Nicholas Coffey is a 66 y.o. year old male who sees [name], [name], MD for primary care. I spoke with  Nicholas Coffey by phone today  What matters to the patients health and wellness today?  No current needs. Pt took NP # for future reference.   SDOH assessments and interventions completed:  No   Care Coordination Interventions Activated:  No  Care Coordination Interventions:  No, not indicated   Follow up plan: No further intervention required.   Encounter Outcome:  Pt. Visit Completed   Noralyn Pick C. Burgess Estelle, MSN, [facility] Gerontological Nurse Practitioner [facility] Care Management [phone]

## 2022-06-14 ENCOUNTER — Emergency Department (HOSPITAL_BASED_OUTPATIENT_CLINIC_OR_DEPARTMENT_OTHER): Payer: Medicare Other

## 2022-06-14 ENCOUNTER — Encounter (HOSPITAL_BASED_OUTPATIENT_CLINIC_OR_DEPARTMENT_OTHER): Payer: Self-pay | Admitting: Emergency Medicine

## 2022-06-14 ENCOUNTER — Other Ambulatory Visit: Payer: Self-pay

## 2022-06-14 ENCOUNTER — Emergency Department (HOSPITAL_BASED_OUTPATIENT_CLINIC_OR_DEPARTMENT_OTHER)
Admission: EM | Admit: 2022-06-14 | Discharge: 2022-06-14 | Disposition: A | Discharge status: Home / Self Care | Payer: Medicare Other | Attending: Emergency Medicine | Admitting: Emergency Medicine

## 2022-06-14 DIAGNOSIS — Z79899 Other long term (current) drug therapy: Secondary | ICD-10-CM | POA: Insufficient documentation

## 2022-06-14 DIAGNOSIS — K529 Noninfective gastroenteritis and colitis, unspecified: Secondary | ICD-10-CM

## 2022-06-14 DIAGNOSIS — R112 Nausea with vomiting, unspecified: Secondary | ICD-10-CM | POA: Diagnosis present

## 2022-06-14 DIAGNOSIS — R001 Bradycardia, unspecified: Secondary | ICD-10-CM | POA: Insufficient documentation

## 2022-06-14 DIAGNOSIS — I10 Essential (primary) hypertension: Secondary | ICD-10-CM | POA: Diagnosis not present

## 2022-06-14 DIAGNOSIS — R519 Headache, unspecified: Secondary | ICD-10-CM | POA: Insufficient documentation

## 2022-06-14 DIAGNOSIS — E876 Hypokalemia: Secondary | ICD-10-CM | POA: Insufficient documentation

## 2022-06-14 LAB — COMPREHENSIVE METABOLIC PANEL
ALT: 17 U/L (ref 0–44)
AST: 14 U/L — ABNORMAL LOW (ref 15–41)
Albumin: 4.1 g/dL (ref 3.5–5.0)
Alkaline Phosphatase: 93 U/L (ref 38–126)
Anion gap: 10 (ref 5–15)
BUN: 14 mg/dL (ref 8–23)
CO2: 25 mmol/L (ref 22–32)
Calcium: 8.9 mg/dL (ref 8.9–10.3)
Chloride: 100 mmol/L (ref 98–111)
Creatinine, Ser: 1.09 mg/dL (ref 0.61–1.24)
GFR, Estimated: >60 mL/min (ref >60)
Glucose, Bld: 107 mg/dL — ABNORMAL HIGH (ref 70–99)
Potassium: 2.8 mmol/L — ABNORMAL LOW (ref 3.5–5.1)
Sodium: 135 mmol/L (ref 135–145)
Total Bilirubin: 1.1 mg/dL (ref 0.3–1.2)
Total Protein: 8.1 g/dL (ref 6.5–8.1)

## 2022-06-14 LAB — CBC WITH DIFFERENTIAL/PLATELET
Abs Immature Granulocytes: 0.01 10*3/uL (ref 0.00–0.07)
Basophils Absolute: 0 10*3/uL (ref 0.0–0.1)
Basophils Relative: 0 %
Eosinophils Absolute: 0.1 10*3/uL (ref 0.0–0.5)
Eosinophils Relative: 2 %
HCT: 46 % (ref 39.0–52.0)
Hemoglobin: 16 g/dL (ref 13.0–17.0)
Immature Granulocytes: 0 %
Lymphocytes Relative: 21 %
Lymphs Abs: 1.6 10*3/uL (ref 0.7–4.0)
MCH: 30.4 pg (ref 26.0–34.0)
MCHC: 34.8 g/dL (ref 30.0–36.0)
MCV: 87.3 fL (ref 80.0–100.0)
Monocytes Absolute: 0.7 10*3/uL (ref 0.1–1.0)
Monocytes Relative: 10 %
Neutro Abs: 5 10*3/uL (ref 1.7–7.7)
Neutrophils Relative %: 67 %
Platelets: 159 10*3/uL (ref 150–400)
RBC: 5.27 MIL/uL (ref 4.22–5.81)
RDW: 13.4 % (ref 11.5–15.5)
WBC: 7.4 10*3/uL (ref 4.0–10.5)
nRBC: 0 % (ref 0.0–0.2)

## 2022-06-14 LAB — URINALYSIS, MICROSCOPIC (REFLEX)

## 2022-06-14 LAB — URINALYSIS, ROUTINE W REFLEX MICROSCOPIC
Bilirubin Urine: NEGATIVE
Glucose, UA: NEGATIVE mg/dL
Ketones, ur: NEGATIVE mg/dL
Leukocytes,Ua: NEGATIVE
Nitrite: NEGATIVE
Protein, ur: NEGATIVE mg/dL
Specific Gravity, Urine: 1.02 (ref 1.005–1.030)
pH: 5.5 (ref 5.0–8.0)

## 2022-06-14 LAB — LIPASE, BLOOD: Lipase: 35 U/L (ref 11–51)

## 2022-06-14 LAB — MAGNESIUM: Magnesium: 1.8 mg/dL (ref 1.7–2.4)

## 2022-06-14 MED ORDER — LACTATED RINGERS IV BOLUS
1000.0000 mL | Freq: Once | INTRAVENOUS | Status: AC
  Administered 2022-06-14: 1000 mL via INTRAVENOUS

## 2022-06-14 MED ORDER — FAMOTIDINE 20 MG PO TABS: 20.0000 mg | ORAL_TABLET | Freq: Two times a day (BID) | ORAL | 0 refills | Status: AC

## 2022-06-14 MED ORDER — POTASSIUM CHLORIDE 20 MEQ PO PACK: 60.0000 meq | PACK | ORAL | Status: DC

## 2022-06-14 MED ORDER — ONDANSETRON HCL 4 MG/2ML IJ SOLN
4.0000 mg | Freq: Once | INTRAMUSCULAR | Status: AC
  Administered 2022-06-14: 4 mg via INTRAVENOUS
  Filled 2022-06-14: qty 2

## 2022-06-14 MED ORDER — IOHEXOL 300 MG/ML  SOLN
100.0000 mL | Freq: Once | INTRAMUSCULAR | Status: AC | PRN
  Administered 2022-06-14: 100 mL via INTRAVENOUS

## 2022-06-14 MED ORDER — POTASSIUM CHLORIDE 10 MEQ/100ML IV SOLN
10.0000 meq | INTRAVENOUS | Status: AC
  Administered 2022-06-14 (×2): 10 meq via INTRAVENOUS
  Filled 2022-06-14: qty 100

## 2022-06-14 MED ORDER — ONDANSETRON 4 MG PO TBDP: 4.0000 mg | ORAL_TABLET | Freq: Three times a day (TID) | ORAL | 0 refills | Status: AC | PRN

## 2022-06-14 MED ORDER — POTASSIUM CHLORIDE CRYS ER 20 MEQ PO TBCR
60.0000 meq | EXTENDED_RELEASE_TABLET | Freq: Once | ORAL | Status: AC
  Administered 2022-06-14: 60 meq via ORAL
  Filled 2022-06-14: qty 3

## 2022-06-14 MED ORDER — POTASSIUM CHLORIDE CRYS ER 20 MEQ PO TBCR: 20.0000 meq | EXTENDED_RELEASE_TABLET | Freq: Every day | ORAL | 0 refills | Status: AC

## 2022-06-14 NOTE — ED Triage Notes (Signed)
N/v/d x 1 week states his daughter, pt not eating and has been weak

## 2022-06-14 NOTE — ED Provider Notes (Signed)
EMERGENCY DEPARTMENT AT [facility] Provider Note   CSN: [id number] Arrival date & time: 06/14/22  1914     History  Chief Complaint  Patient presents with   Nausea    Nicholas Coffey is a 67 y.o. male.  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  Says that he has had persistent nausea and vomiting that has been nonbloody nonbilious for the past 10 days.  Also with intermittent lower abdominal pain that comes in waves.  Denies any dysuria or frequency.  Has also been having nonbloody nonmelanotic diarrhea.  Had a VP shunt that was placed but no other abdominal surgeries.  Has had intermittent headaches as well.  Says that the vomiting is worse in the morning and at night.  No fevers.  Has tried multiple over-the-counter medications for this which have not improved his symptoms so came into the emergency department.  No illicit drug use or alcohol use.       Home Medications Prior to Admission medications   Medication Sig Start Date End Date Taking? Authorizing Provider  famotidine (PEPCID) 20 MG tablet Take 1 tablet (20 mg total) by mouth 2 (two) times daily. 06/14/22  Yes Rondel Baton, MD  ondansetron (ZOFRAN-ODT) 4 MG disintegrating tablet Take 1 tablet (4 mg total) by mouth every 8 (eight) hours as needed for nausea or vomiting. 06/14/22  Yes Rondel Baton, MD  potassium chloride SA (KLOR-CON M) 20 MEQ tablet Take 1 tablet (20 mEq total) by mouth daily for 4 days. 06/14/22 06/18/22 Yes Rondel Baton, MD  amLODipine-benazepril (LOTREL) 5-40 MG capsule Take 1 capsule by mouth daily. 09/19/20   [provider]  atorvastatin (LIPITOR) 40 MG tablet Take 40 mg by mouth daily. 09/24/20   [provider]  bisacodyl (DULCOLAX) 5 MG EC tablet Take 1 tablet (5 mg total)  by mouth daily as needed for moderate constipation. Dulcolax laxative 5 mg tablets x 4 tablets only for colon prep 05/28/21   Sherrilyn Rist, MD  butalbital-acetaminophen-caffeine (FIORICET) [phone] MG tablet SMARTSIG:1 Tablet(s) By Mouth 1-3 Times Daily PRN 11/25/20   [provider]  furosemide (LASIX) 20 MG tablet Take 20 mg by mouth 2 (two) times daily. 01/25/19   [provider]      Allergies    Codeine    Review of Systems   Review of Systems  Physical Exam Updated Vital Signs BP (!) 162/98 (BP Location: Right Arm)   Pulse (!) 53   Temp 98.3 F (36.8 C) (Oral)   Resp 16   Ht 5\' 9"  (1.753 m)   Wt 90.7 kg   SpO2 100%   BMI 29.53 kg/m  Physical Exam Vitals and nursing note reviewed.  Constitutional:      General: He is not in acute distress.    Appearance: He is well-developed.  HENT:     Head: Normocephalic and atraumatic.     Right Ear: External ear normal.     Left Ear: External ear normal.     Nose: Nose normal.  Eyes:     Extraocular Movements: Extraocular movements intact.     Conjunctiva/sclera: Conjunctivae normal.     Pupils: Pupils are equal, round, and reactive to light.  Cardiovascular:     Rate and Rhythm: Regular rhythm. Bradycardia present.  Pulmonary:  Effort: Pulmonary effort is normal. No respiratory distress.  Abdominal:     General: There is no distension.     Palpations: Abdomen is soft. There is no mass.     Tenderness: There is no abdominal tenderness. There is no guarding.  Musculoskeletal:     Cervical back: Normal range of motion and neck supple.  Skin:    General: Skin is warm and dry.  Neurological:     Mental Status: He is alert. Mental status is at baseline.  Psychiatric:        Mood and Affect: Mood normal.        Behavior: Behavior normal.     ED Results / Procedures / Treatments   Labs (all labs ordered are listed, but only abnormal results are displayed) Labs Reviewed  COMPREHENSIVE METABOLIC  PANEL - Abnormal; Notable for the following components:      Result Value   Potassium 2.8 (*)    Glucose, Bld 107 (*)    AST 14 (*)    All other components within normal limits  URINALYSIS, ROUTINE W REFLEX MICROSCOPIC - Abnormal; Notable for the following components:   Hgb urine dipstick SMALL (*)    All other components within normal limits  URINALYSIS, MICROSCOPIC (REFLEX) - Abnormal; Notable for the following components:   Bacteria, UA RARE (*)    All other components within normal limits  LIPASE, BLOOD  CBC WITH DIFFERENTIAL/PLATELET  MAGNESIUM    EKG EKG Interpretation  Date/Time:  Monday June 14 2022 09:27:12 EDT Ventricular Rate:  56 PR Interval:  196 QRS Duration: 103 QT Interval:  502 QTC Calculation: 485 R Axis:   -35 Text Interpretation: Sinus rhythm Left axis deviation Anterior infarct, old Confirmed by Vonita Moss [phone]) on 06/14/2022 9:36:23 AM  Radiology CT ABDOMEN PELVIS W CONTRAST  Result Date: 06/14/2022 CLINICAL DATA:  Lower abdominal pain nausea vomiting EXAM: CT ABDOMEN AND PELVIS WITH CONTRAST TECHNIQUE: Multidetector CT imaging of the abdomen and pelvis was performed using the standard protocol following bolus administration of intravenous contrast. RADIATION DOSE REDUCTION: This exam was performed according to the departmental dose-optimization program which includes automated exposure control, adjustment of the mA and/or kV according to patient size and/or use of iterative reconstruction technique. CONTRAST:  OMNIPAQUE IOHEXOL 300 MG/ML  SOLN COMPARISON:  Ultrasound report 2012 FINDINGS: Lower chest: There is some linear opacity seen along bases likely scar or atelectasis. No pleural effusion. Hepatobiliary: No focal liver abnormality is seen. No gallstones, gallbladder wall thickening, or biliary dilatation. Pancreas: Unremarkable. No pancreatic ductal dilatation or surrounding inflammatory changes. Spleen: Normal in size without focal  abnormality. Adrenals/Urinary Tract: Mild thickening of the adrenal glands, nonspecific. Bilateral renal atrophy. Both kidneys have several small low-attenuation lesions. Many of which are under a cm and too small to characterize. One of the larger foci seen laterally along the left mid kidney measuring 14 mm in short axis and Hounsfield unit of 19 on portal venous phase and 17 on delayed consistent with a cyst. These are Bosniak 1 and 2 lesions. No specific imaging follow-up. Collecting system dilatation. The ureters have normal course and caliber down to the bladder. Stomach/Bowel: Extensive diffuse colonic diverticulosis. Large bowel is nondilated. Normal retrocecal appendix. Stomach and small bowel is nondilated. There is a long segment of distal and terminal ileal wall thickening with stranding and adjacent mesenteric fluid with vascular congestion. Possibilities would include inflammatory bowel disease there is differential infectious etiology as well. The small bowel is nondilated. Stomach is  nondilated. Vascular/Lymphatic: Aortic atherosclerosis. No enlarged abdominal or pelvic lymph nodes. Reproductive: Prostate is slightly enlarged. Slight mass effect along the base of the bladder. Other: Presumed VP shunt seen along the lower anterior thorax subcutaneous fat. Entering the abdomen along the right upper quadrant. Tip extends into the anterior left hemipelvis. Small amount of free fluid in the pelvis. Musculoskeletal: Mild degenerative changes along the spine. IMPRESSION: Wall thickening seen with the inflammatory changes along a long segment of the distal ileum. Possibilities would include inflammatory bowel disease. There is differential. Please correlate for any known history and further workup. Small amount of nonspecific free fluid in the pelvis. Patient does have a VP shunt. Colonic diverticulosis. Mild bilateral renal atrophy with benign cystic lesions. No specific imaging follow-up. Electronically  Signed   By: Karen Kays M.D.   On: 06/14/2022 11:27   CT Head Wo Contrast  Result Date: 06/14/2022 CLINICAL DATA:  Headaches, nausea, vomiting EXAM: CT HEAD WITHOUT CONTRAST TECHNIQUE: Contiguous axial images were obtained from the base of the skull through the vertex without intravenous contrast. RADIATION DOSE REDUCTION: This exam was performed according to the departmental dose-optimization program which includes automated exposure control, adjustment of the mA and/or kV according to patient size and/or use of iterative reconstruction technique. COMPARISON:  Previous studies including the examination of 05/16/2021 FINDINGS: Brain: No acute intracranial findings are seen There are no signs of bleeding within the cranium. Ventriculoperitoneal shunt catheter is entering the right parietal calvarium with the tip of the catheter projecting anterior to the frontal horn of right lateral ventricle. There is no change in position of the tip of the shunt. Ventricles are not dilated. There is encephalomalacia in the posterior left cerebellum suggesting old infarct with no interval change. There are small old lacunar infarcts in basal ganglia on both sides with no significant interval change. Cortical sulci are prominent. Vascular: Unremarkable. Skull: No fracture is seen in calvarium. Sinuses/Orbits: Unremarkable. Other: None. IMPRESSION: No acute intracranial findings are seen. Ventriculoperitoneal shunt catheter is seen on the right side with no interval change in position. 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 Atrophy. Electronically Signed   By: Ernie Avena M.D.   On: 06/14/2022 11:25    Procedures Procedures   Medications Ordered in ED Medications  lactated ringers bolus 1,000 mL (0 mLs Intravenous Stopped 06/14/22 1222)  ondansetron (ZOFRAN) injection 4 mg (4 mg Intravenous Given 06/14/22 1033)  iohexol (OMNIPAQUE) 300 MG/ML solution 100 mL (100 mLs  Intravenous Contrast Given 06/14/22 1046)  potassium chloride 10 mEq in 100 mL IVPB (0 mEq Intravenous Stopped 06/14/22 1338)  potassium chloride SA (KLOR-CON M) CR tablet 60 mEq (60 mEq Oral Given 06/14/22 1113)    ED Course/ Medical Decision Making/ A&P Clinical Course as of 06/14/22 1631  Mon Jun 14, 2022  1139 CT ABDOMEN PELVIS W CONTRAST IMPRESSION: Wall thickening seen with the inflammatory changes along a long segment of the distal  ileum. Possibilities would include inflammatory bowel disease. There is differential. Please correlate for any known history and further workup.  Small amount of nonspecific free fluid in the pelvis. Patient does have a VP shunt.  Colonic diverticulosis.  Mild bilateral renal atrophy with benign cystic lesions. No specific imaging follow-up. [RP]  1149 PA Vehr from GI consulted.  Recommends sending the patient home on a bland diet and and to have a colonoscopy as an outpatient.  States that they can perform ileoscopy at that time as well. [  RP]    Clinical Course User Index [RP] Rondel Baton, MD                             Medical Decision Making Amount and/or Complexity of Data Reviewed Labs: ordered. Radiology: ordered. Decision-making details documented in ED Course.  Risk Prescription drug management.   Nicholas Coffey is a 67 y.o. 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  Also on the differential with his abdominal surgeries in the age and risk factors would be a bowel obstruction and diverticulitis.  With his headache could also have  hydrocephalus from shunt malfunction that could be causing his headache and nausea and vomiting.  Plan:  Labs Lipase Urinalysis IV fluids Zofran CT head CT abdomen pelvis  ED Summary/Re-evaluation:  Patient underwent the above workup and was found to be hypokalemic to 2.8.  He was given IV potassium as well as oral repletion.  CT scan showed distal ileum inflammation that was concerning for possible inflammatory bowel disease.  Patient is still not had his colonoscopy so discussed with GI who recommended that he follow-up with them as an outpatient.  Did not feel that any acute interventions were required for him aside from symptom control.  He was able to tolerate p.o. prior to discharge.  They have set him up for an outpatient follow-up.  He was sent home with Zofran as well as oral potassium to take for the next few days.  This patient presents to the ED for concern of complaints listed in HPI, this involves an extensive number of treatment options, and is a complaint that carries with it a high risk of complications and morbidity. Disposition including potential need for admission considered.   Dispo: DC Home. Return precautions discussed including, but not limited to, those listed in the AVS. Allowed pt time to ask questions which were answered fully prior to dc.  Additional history obtained from daughter Records reviewed Outpatient Clinic Notes The following labs were independently interpreted: Chemistry and show  hypokalemia I independently reviewed the following imaging with scope of interpretation limited to determining acute life threatening conditions related to emergency care: CT Head and agree with the radiologist interpretation with the following exceptions: none I personally reviewed and interpreted cardiac monitoring: normal sinus rhythm  I personally reviewed and interpreted the pt's EKG: see above for interpretation  I have reviewed the patients home medications and made  adjustments as needed Consults: Gastroenterology Social Determinants of health:  Elderly  Final Clinical Impression(s) / ED Diagnoses Final diagnoses:  Gastroenteritis  Ileitis    Rx / DC Orders ED Discharge Orders          Ordered    famotidine (PEPCID) 20 MG tablet  2 times daily        06/14/22 1152    ondansetron (ZOFRAN-ODT) 4 MG disintegrating tablet  Every 8 hours PRN        06/14/22 1152    potassium chloride SA (KLOR-CON M) 20 MEQ tablet  Daily        06/14/22 1226              Roberts Bon,  Enzo Montgomery, MD 06/14/22 [phone]

## 2022-06-14 NOTE — Discharge Instructions (Addendum)
You were seen for your nausea and vomiting and diarrhea in the emergency department.   At home, please use the Zofran we have prescribed you for your nausea and vomiting.  Please take the potassium for your low potassium.  Take the Pepcid for your discomfort.    Check your MyChart online for the results of any tests that had not resulted by the time you left the emergency department.   Follow-up with your primary doctor in 2-3 days regarding your visit.  Follow-up with the GI doctors as soon as possible.   Return immediately to the emergency department if you experience any of the following: Severe abdominal pain, vomiting despite the medications, or any other concerning symptoms.    Thank you for visiting our Emergency Department. It was a pleasure taking care of you today.

## 2022-07-21 ENCOUNTER — Ambulatory Visit: Payer: Medicare Other | Admitting: Internal Medicine
